# Patient Record
Sex: Female | Born: 1995 | Race: Black or African American | Hispanic: No | Marital: Single | Smoking: Current every day smoker
Health system: Southern US, Community
[De-identification: ages and names within clinical notes are randomized; demographics above are authoritative.]

## PROBLEM LIST (undated history)

## (undated) DIAGNOSIS — B009 Herpesviral infection, unspecified: Secondary | ICD-10-CM

## (undated) DIAGNOSIS — E669 Obesity, unspecified: Secondary | ICD-10-CM

## (undated) DIAGNOSIS — A749 Chlamydial infection, unspecified: Secondary | ICD-10-CM

## (undated) DIAGNOSIS — Z309 Encounter for contraceptive management, unspecified: Secondary | ICD-10-CM

## (undated) DIAGNOSIS — Z8619 Personal history of other infectious and parasitic diseases: Secondary | ICD-10-CM

## (undated) HISTORY — DX: Encounter for contraceptive management, unspecified: Z30.9

## (undated) HISTORY — DX: Herpesviral infection, unspecified: B00.9

## (undated) HISTORY — PX: EXTERNAL EAR SURGERY: SHX627

## (undated) HISTORY — PX: SALIVARY GLAND SURGERY: SHX768

## (undated) HISTORY — DX: Obesity, unspecified: E66.9

## (undated) HISTORY — DX: Personal history of other infectious and parasitic diseases: Z86.19

## (undated) HISTORY — DX: Chlamydial infection, unspecified: A74.9

---
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
## 2015-03-31 ENCOUNTER — Ambulatory Visit (INDEPENDENT_AMBULATORY_CARE_PROVIDER_SITE_OTHER): Payer: Medicaid Other | Admitting: Pediatrics

## 2015-03-31 VITALS — Wt 271.6 lb

## 2015-03-31 DIAGNOSIS — R894 Abnormal immunological findings in specimens from other organs, systems and tissues: Secondary | ICD-10-CM | POA: Insufficient documentation

## 2015-03-31 DIAGNOSIS — Z3041 Encounter for surveillance of contraceptive pills: Secondary | ICD-10-CM | POA: Diagnosis not present

## 2015-03-31 MED ORDER — LEVONORGEST-ETH ESTRAD 91-DAY 0.15-0.03 &0.01 MG PO TABS: 1.0000 | ORAL_TABLET | Freq: Every day | ORAL | Status: DC
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
## 2015-05-26 ENCOUNTER — Encounter: Payer: Medicaid Other | Admitting: Pediatrics

## 2015-11-06 ENCOUNTER — Encounter: Payer: Self-pay | Admitting: Adult Health

## 2015-11-06 ENCOUNTER — Ambulatory Visit (INDEPENDENT_AMBULATORY_CARE_PROVIDER_SITE_OTHER): Payer: Medicaid Other | Admitting: Adult Health

## 2015-11-06 VITALS — BP 100/68 | HR 70 | Ht 68.0 in | Wt 266.0 lb

## 2015-11-06 DIAGNOSIS — Z309 Encounter for contraceptive management, unspecified: Secondary | ICD-10-CM | POA: Diagnosis not present

## 2015-11-06 DIAGNOSIS — Z3202 Encounter for pregnancy test, result negative: Secondary | ICD-10-CM

## 2015-11-06 DIAGNOSIS — Z30011 Encounter for initial prescription of contraceptive pills: Secondary | ICD-10-CM

## 2015-11-06 DIAGNOSIS — Z01419 Encounter for gynecological examination (general) (routine) without abnormal findings: Secondary | ICD-10-CM

## 2015-11-06 DIAGNOSIS — Z113 Encounter for screening for infections with a predominantly sexual mode of transmission: Secondary | ICD-10-CM

## 2015-11-06 HISTORY — DX: Encounter for contraceptive management, unspecified: Z30.9

## 2015-11-06 LAB — POCT URINE PREGNANCY: Preg Test, Ur: NEGATIVE

## 2015-11-06 MED ORDER — NORETHIN-ETH ESTRAD-FE BIPHAS 1 MG-10 MCG / 10 MCG PO TABS: 1.0000 | ORAL_TABLET | Freq: Every day | ORAL | Status: DC

## 2015-11-06 NOTE — Progress Notes (Signed)
Patient ID: Alejandra Diaz N Melichar, female   DOB: 10/04/1996, 19 y.o.   MRN: [id number] History of Present Illness:  Alejandra Diaz is a 19 year old black female in for a well woman gyn exam and get on birth control,has family planning medicaid.She is using condoms.  Current Medications, Allergies, Past Medical History, Past Surgical History, Family History and Social History were reviewed in [facility] electronic medical record.     Review of Systems:  Patient denies any headaches, hearing loss, fatigue, blurred vision, shortness of breath, chest pain, abdominal pain, problems with bowel movements, urination, or intercourse. No joint pain or mood swings.   Physical Exam:BP 100/68 mmHg  Pulse 70  Ht 5\' 8"  (1.727 m)  Wt 266 lb (120.657 kg)  BMI 40.45 kg/m2  LMP 10/31/2015 UPT negative General:  Well developed, well nourished, no acute distress Skin:  Warm and dry Neck:  Midline trachea, normal thyroid, good ROM, no lymphadenopathy Lungs; Clear to auscultation bilaterally Breast:  No dominant palpable mass, retraction, or nipple discharge Cardiovascular: Regular rate and rhythm Abdomen:  Soft, non tender, no hepatosplenomegaly,obese Pelvic:  External genitalia is normal in appearance, no lesions.  The vagina is normal in appearance,pink period like blood. Urethra has no lesions or masses. The cervix is smooth.  Uterus is felt to be normal size, shape, and contour.  No adnexal masses or tenderness noted.Bladder is non tender, no masses felt. Extremities/musculoskeletal:  No swelling or varicosities noted, no clubbing or cyanosis Psych:  No mood changes, alert and cooperative,seems happy   Impression: Well woman gyn exam no pap Contraceptive management STD screening    Plan: Rx lo loestrin disp 1 pack take 1 daily with 11 refills,given 1 pack to start now, lot [id number] A exp 6/17  Check GC/CHL on urine HIV,RPR,HSV 2 Pap and physical in 1 year

## 2015-11-06 NOTE — Patient Instructions (Signed)
Start lo loestrin today Use condoms Pap and physical in  1 year

## 2015-11-07 LAB — RPR: RPR: NONREACTIVE

## 2015-11-07 LAB — HIV ANTIBODY (ROUTINE TESTING W REFLEX): HIV SCREEN 4TH GENERATION: NONREACTIVE

## 2015-11-07 LAB — HSV 2 ANTIBODY, IGG

## 2015-11-10 ENCOUNTER — Telehealth: Payer: Self-pay | Admitting: Adult Health

## 2015-11-10 LAB — GC/CHLAMYDIA PROBE AMP
Chlamydia trachomatis, NAA: POSITIVE — AB
Neisseria gonorrhoeae by PCR: NEGATIVE

## 2015-11-10 NOTE — Telephone Encounter (Signed)
Left message to call me in am 

## 2015-11-11 ENCOUNTER — Telehealth: Payer: Self-pay | Admitting: *Deleted

## 2015-11-11 MED ORDER — AZITHROMYCIN 500 MG PO TABS: ORAL_TABLET | ORAL | Status: DC

## 2015-11-11 NOTE — Addendum Note (Signed)
Addended by: Cyril MourningGRIFFIN, JENNIFER A on: 11/11/2015 09:15 AM   Modules accepted: Orders

## 2015-11-11 NOTE — Telephone Encounter (Signed)
Pt aware of labs and +CHL,rx azthromycin NCCDRDC sent,[place] [facility] [facility]

## 2015-12-04 ENCOUNTER — Ambulatory Visit (INDEPENDENT_AMBULATORY_CARE_PROVIDER_SITE_OTHER): Payer: Self-pay | Admitting: Adult Health

## 2015-12-04 ENCOUNTER — Encounter: Payer: Self-pay | Admitting: Adult Health

## 2015-12-04 VITALS — BP 90/62 | HR 78 | Ht 68.0 in | Wt 271.5 lb

## 2015-12-04 DIAGNOSIS — Z8619 Personal history of other infectious and parasitic diseases: Secondary | ICD-10-CM

## 2015-12-04 HISTORY — DX: Personal history of other infectious and parasitic diseases: Z86.19

## 2015-12-04 NOTE — Patient Instructions (Signed)
Use condoms and continue OCs Follow up prn

## 2015-12-04 NOTE — Progress Notes (Signed)
Subjective:     Patient ID: Alejandra Diaz N Silveri, female   DOB: 06/20/1996, 19 y.o.   MRN: [id number]  HPI [name] is a 19 year old black female in for proof of treatment for +chlamydia in November.She and partner took their meds and have not had sex.No complaints.  Review of Systems Patient denies any headaches, hearing loss, fatigue, blurred vision, shortness of breath, chest pain, abdominal pain, problems with bowel movements, urination, or intercourse. No joint pain or mood swings. Reviewed past medical,surgical, social and family history. Reviewed medications and allergies.     Objective:   Physical Exam BP 90/62 mmHg  Pulse 78  Ht 5\' 8"  (1.727 m)  Wt 271 lb 8 oz (123.152 kg)  BMI 41.29 kg/m2  LMP 10/31/2015 Skin warm and dry. Lungs: clear to ausculation bilaterally. Cardiovascular: regular rate and rhythm.Face time 10 minutes, counseling about using condoms for STD prevention and reviewed labs again with her.    Assessment:     History of chlamydia     Plan:     GC/CHL sent on urine Use condoms and continue OCs Follow up prn

## 2015-12-05 LAB — GC/CHLAMYDIA PROBE AMP
CHLAMYDIA, DNA PROBE: NEGATIVE
Neisseria gonorrhoeae by PCR: NEGATIVE

## 2016-01-16 ENCOUNTER — Emergency Department (HOSPITAL_COMMUNITY)
Admission: EM | Admit: 2016-01-16 | Discharge: 2016-01-16 | Disposition: A | Discharge status: Home / Self Care | Payer: No Typology Code available for payment source | Attending: Emergency Medicine | Admitting: Emergency Medicine

## 2016-01-16 ENCOUNTER — Encounter (HOSPITAL_COMMUNITY): Payer: Self-pay

## 2016-01-16 ENCOUNTER — Emergency Department (HOSPITAL_COMMUNITY): Payer: No Typology Code available for payment source

## 2016-01-16 DIAGNOSIS — Y92410 Unspecified street and highway as the place of occurrence of the external cause: Secondary | ICD-10-CM | POA: Insufficient documentation

## 2016-01-16 DIAGNOSIS — E669 Obesity, unspecified: Secondary | ICD-10-CM | POA: Diagnosis not present

## 2016-01-16 DIAGNOSIS — Y998 Other external cause status: Secondary | ICD-10-CM | POA: Diagnosis not present

## 2016-01-16 DIAGNOSIS — S63501A Unspecified sprain of right wrist, initial encounter: Secondary | ICD-10-CM | POA: Diagnosis not present

## 2016-01-16 DIAGNOSIS — Z3202 Encounter for pregnancy test, result negative: Secondary | ICD-10-CM | POA: Insufficient documentation

## 2016-01-16 DIAGNOSIS — S199XXA Unspecified injury of neck, initial encounter: Secondary | ICD-10-CM | POA: Diagnosis present

## 2016-01-16 DIAGNOSIS — Z793 Long term (current) use of hormonal contraceptives: Secondary | ICD-10-CM | POA: Insufficient documentation

## 2016-01-16 DIAGNOSIS — Y9389 Activity, other specified: Secondary | ICD-10-CM | POA: Insufficient documentation

## 2016-01-16 DIAGNOSIS — S161XXA Strain of muscle, fascia and tendon at neck level, initial encounter: Secondary | ICD-10-CM | POA: Insufficient documentation

## 2016-01-16 DIAGNOSIS — Z8619 Personal history of other infectious and parasitic diseases: Secondary | ICD-10-CM | POA: Diagnosis not present

## 2016-01-16 LAB — POC URINE PREG, ED
PREG TEST UR: NEGATIVE
Preg Test, Ur: NEGATIVE

## 2016-01-16 MED ORDER — IBUPROFEN 800 MG PO TABS
800.0000 mg | ORAL_TABLET | Freq: Once | ORAL | Status: AC
  Administered 2016-01-16: 800 mg via ORAL
  Filled 2016-01-16: qty 1

## 2016-01-16 MED ORDER — IBUPROFEN 800 MG PO TABS: 800.0000 mg | ORAL_TABLET | Freq: Three times a day (TID) | ORAL | Status: DC

## 2016-01-16 MED ORDER — CYCLOBENZAPRINE HCL 10 MG PO TABS: 10.0000 mg | ORAL_TABLET | Freq: Three times a day (TID) | ORAL | Status: DC | PRN
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
## 2016-01-16 NOTE — ED Notes (Signed)
Pt alert & oriented x4, stable gait. Patient given discharge instructions, paperwork & prescription(s). Patient  instructed to stop at the registration desk to finish any additional paperwork. Patient verbalized understanding. Pt left department w/ no further questions. 

## 2016-01-16 NOTE — ED Notes (Signed)
PA notified & OK w/ removing collar. C collar removed after negative c-spine resulted.

## 2016-01-16 NOTE — ED Notes (Signed)
Pt placed in c collar when moved to treatment room.
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
## 2016-10-13 ENCOUNTER — Ambulatory Visit: Payer: Medicaid Other | Admitting: Adult Health

## 2017-02-09 ENCOUNTER — Other Ambulatory Visit: Payer: Medicaid Other | Admitting: Adult Health

## 2017-02-24 ENCOUNTER — Other Ambulatory Visit: Payer: Medicaid Other | Admitting: Adult Health

## 2017-03-10 ENCOUNTER — Other Ambulatory Visit: Payer: Medicaid Other | Admitting: Adult Health

## 2017-10-29 ENCOUNTER — Emergency Department (HOSPITAL_COMMUNITY)
Admission: EM | Admit: 2017-10-29 | Discharge: 2017-10-29 | Disposition: A | Discharge status: Home / Self Care | Payer: Self-pay | Attending: Emergency Medicine | Admitting: Emergency Medicine

## 2017-10-29 ENCOUNTER — Encounter (HOSPITAL_COMMUNITY): Payer: Self-pay | Admitting: Emergency Medicine

## 2017-10-29 ENCOUNTER — Other Ambulatory Visit: Payer: Self-pay

## 2017-10-29 DIAGNOSIS — Z79899 Other long term (current) drug therapy: Secondary | ICD-10-CM | POA: Insufficient documentation

## 2017-10-29 DIAGNOSIS — Z202 Contact with and (suspected) exposure to infections with a predominantly sexual mode of transmission: Secondary | ICD-10-CM

## 2017-10-29 DIAGNOSIS — N898 Other specified noninflammatory disorders of vagina: Secondary | ICD-10-CM

## 2017-10-29 DIAGNOSIS — R300 Dysuria: Secondary | ICD-10-CM

## 2017-10-29 LAB — URINALYSIS, ROUTINE W REFLEX MICROSCOPIC
BILIRUBIN URINE: NEGATIVE
GLUCOSE, UA: NEGATIVE mg/dL
Ketones, ur: 5 mg/dL — AB
NITRITE: NEGATIVE
PH: 5 (ref 5.0–8.0)
Protein, ur: 30 mg/dL — AB
SPECIFIC GRAVITY, URINE: 1.028 (ref 1.005–1.030)

## 2017-10-29 LAB — WET PREP, GENITAL
Clue Cells Wet Prep HPF POC: NONE SEEN
SPERM: NONE SEEN
Trich, Wet Prep: NONE SEEN
YEAST WET PREP: NONE SEEN

## 2017-10-29 LAB — PREGNANCY, URINE: PREG TEST UR: NEGATIVE

## 2017-10-29 MED ORDER — AZITHROMYCIN 250 MG PO TABS
1000.0000 mg | ORAL_TABLET | Freq: Once | ORAL | Status: AC
  Administered 2017-10-29: 1000 mg via ORAL
  Filled 2017-10-29: qty 4

## 2017-10-29 MED ORDER — LIDOCAINE HCL (PF) 1 % IJ SOLN
INTRAMUSCULAR | Status: AC
  Administered 2017-10-29: 0.9 mL
  Filled 2017-10-29: qty 5

## 2017-10-29 MED ORDER — CEFTRIAXONE SODIUM 250 MG IJ SOLR
250.0000 mg | Freq: Once | INTRAMUSCULAR | Status: AC
  Administered 2017-10-29: 250 mg via INTRAMUSCULAR
  Filled 2017-10-29: qty 250

## 2017-10-29 NOTE — ED Triage Notes (Addendum)
Patient c/o dysuria x1 week. Denies any fevers, nausea, or vomiting. Denies any blood in urine. Patient does report some white discharge with itching. Denies any odor.

## 2017-10-29 NOTE — ED Provider Notes (Signed)
[facility]GENCY [facility]PARTMENT Provider Note   CSN: [id number] Arrival date & time: 10/29/17  1606     History   Chief Complaint Chief Complaint  Patient presents with  . Dysuria    HPI  Alejandra Diaz is a 21 y.o. Female who presents complaining of 1 week of burning with urination, and some increased vaginal discharge.  Patient reports she recently started dating someone new, and he was recently diagnosed with chlamydia so she wanted to come in and get checked as well.  Patient reports for the past week she has had some burning and discomfort with urination.  She reports discharge is like her typical discharge, slightly whitish, but she has had larger amounts of it over the past week.  Patient denies any fevers or chills, no abdominal pain, no nausea or vomiting.  She reports she uses condoms sometimes is on birth control.        Past Medical History:  Diagnosis Date  . Chlamydia   . Contraceptive management 11/06/2015  . Herpes simplex virus (HSV) infection   . History of chlamydia 12/04/2015  . Obesity     Patient Active Problem List   Diagnosis Date Noted  . History of chlamydia 12/04/2015  . Contraceptive management 11/06/2015  . Herpes simplex antibody positive 03/31/2015    Past Surgical History:  Procedure Laterality Date  . EXTERNAL EAR SURGERY     Keloids removed  . SALIVARY GLAND SURGERY     removed    OB History    Gravida Para Term Preterm AB Living   0 0 0 0 0 0   SAB TAB Ectopic Multiple Live Births   0 0 0 0         Home Medications    Prior to Admission medications   Medication Sig Start Date End Date Taking? Authorizing Provider  cyclobenzaprine (FLEXERIL) 10 MG tablet Take 1 tablet (10 mg total) by mouth 3 (three) times daily as needed. 01/16/16   Triplett, Tammy, PA-C  ibuprofen (ADVIL,MOTRIN) 800 MG tablet Take 1 tablet (800 mg total) by mouth 3 (three) times daily. 01/16/16   Triplett, Tammy, PA-C  Norethindrone-Ethinyl  Estradiol-Fe Biphas (LO LOESTRIN FE) 1 MG-10 MCG / 10 MCG tablet Take 1 tablet by mouth daily. Take 1 daily by mouth 11/06/15   Adline PotterGriffin, Jennifer A, NP    Family History Family History  Problem Relation Age of Onset  . Other Maternal Grandmother        on dialysis  . COPD Paternal Grandmother     Social History Social History   Tobacco Use  . Smoking status: Never Smoker  . Smokeless tobacco: Never Used  Substance Use Topics  . Alcohol use: No  . Drug use: Yes    Types: Marijuana    Comment: once a week     Allergies   Patient has no known allergies.   Review of Systems Review of Systems  Constitutional: Negative for chills and fever.  Gastrointestinal: Negative for abdominal pain, constipation, diarrhea, nausea and vomiting.  Genitourinary: Positive for dysuria, frequency and vaginal discharge. Negative for flank pain, hematuria and vaginal bleeding.  Musculoskeletal: Negative for back pain.  Skin: Negative for rash.     Physical Exam Updated Vital Signs BP 138/84 (BP Location: Right Arm)   Pulse (!) 53   Temp 98.1 F (36.7 C) (Oral)   Resp 18   Ht 5\' 10"  (1.778 m)   Wt 107 kg (236 lb)   LMP 10/18/2017  SpO2 100%   BMI 33.86 kg/m   Physical Exam  Constitutional: She appears well-developed and well-nourished. No distress.  HENT:  Head: Normocephalic and atraumatic.  Mouth/Throat: Oropharynx is clear and moist.  Eyes: Right eye exhibits no discharge. Left eye exhibits no discharge.  Pulmonary/Chest: Effort normal. No respiratory distress.  Abdominal: Soft. Bowel sounds are normal. She exhibits no distension and no mass. There is no tenderness. There is no guarding.  Genitourinary: There is no tenderness or lesion on the right labia. There is no tenderness or lesion on the left labia. Uterus is not tender. Cervix exhibits discharge (Yellow-white). Cervix exhibits no motion tenderness and no friability. Right adnexum displays no mass, no tenderness and no  fullness. Left adnexum displays no mass, no tenderness and no fullness. No erythema, tenderness or bleeding in the vagina. Vaginal discharge found.  Neurological: She is alert. Coordination normal.  Skin: Skin is warm and dry. Capillary refill takes less than 2 seconds. She is not diaphoretic.  Psychiatric: She has a normal mood and affect. Her behavior is normal.  Nursing note and vitals reviewed.    ED Treatments / Results  Labs (all labs ordered are listed, but only abnormal results are displayed) Labs Reviewed  WET PREP, GENITAL - Abnormal; Notable for the following components:      Result Value   WBC, Wet Prep HPF POC MANY (*)    All other components within normal limits  URINALYSIS, ROUTINE W REFLEX MICROSCOPIC - Abnormal; Notable for the following components:   APPearance CLOUDY (*)    Hgb urine dipstick SMALL (*)    Ketones, ur 5 (*)    Protein, ur 30 (*)    Leukocytes, UA LARGE (*)    Bacteria, UA FEW (*)    Squamous Epithelial / LPF TOO NUMEROUS TO COUNT (*)    All other components within normal limits  PREGNANCY, URINE  HIV ANTIBODY (ROUTINE TESTING)  RPR  GC/CHLAMYDIA PROBE AMP ([place]) NOT AT [facility]    EKG  EKG Interpretation None       Radiology No results found.  Procedures Procedures (including critical care time)  Medications Ordered in ED Medications  cefTRIAXone (ROCEPHIN) injection 250 mg (250 mg Intramuscular Given 10/29/17 1713)  azithromycin (ZITHROMAX) tablet 1,000 mg (1,000 mg Oral Given 10/29/17 1713)  lidocaine (PF) (XYLOCAINE) 1 % injection (0.9 mLs  Given 10/29/17 1714)     Initial Impression / Assessment and Plan / ED Course  I have reviewed the triage vital signs and the nursing notes.  Pertinent labs & imaging results that were available during my care of the patient were reviewed by me and considered in my medical decision making (see chart for details).  Pt presents with concerns for possible STD.  Pt understands that they  have GC/Chlamydia cultures pending, as well as HIV and RPR and that they will need to inform all sexual partners if results return positive. Pt has been treated prophylactically with azithromycin and Rocephin due to pts history, pelvic exam, and wet prep with increased WBCs, no clue cells. Pt not concerning for PID because hemodynamically stable and no cervical motion tenderness on pelvic exam.  Patient to be discharged with instructions to follow up with OBGYN/PCP. Discussed importance of using protection when sexually active.    Final Clinical Impressions(s) / ED Diagnoses   Final diagnoses:  Vaginal discharge  STD exposure  Dysuria    ED Discharge Orders    None       Ala Dach,  Arva ChafeKelsey N, PA-C 10/29/17 1744    Samuel JesterMcManus, Kathleen, DO 11/01/17 1007

## 2017-10-29 NOTE — Discharge Instructions (Addendum)
You were treated for gonorrhea and chlamydia today.  You will be contacted in 2-3 days if any of your test results come back positive.  Please use condoms for protection and inform any other sexual partners.  If you develop fevers, chills, nausea or vomiting, or lower abdominal pain please return to the emergency department for reevaluation.  Otherwise you may follow-up with health department for similar complaints in the future.  You may also use the phone number provided to establish care with a primary doctor.

## 2017-10-29 NOTE — ED Notes (Signed)
Call to lab- need labs in rm 20

## 2017-10-30 LAB — HIV ANTIBODY (ROUTINE TESTING W REFLEX): HIV SCREEN 4TH GENERATION: NONREACTIVE

## 2017-10-30 LAB — RPR: RPR Ser Ql: NONREACTIVE

## 2017-10-31 LAB — GC/CHLAMYDIA PROBE AMP (~~LOC~~) NOT AT ARMC
Chlamydia: POSITIVE — AB
Neisseria Gonorrhea: NEGATIVE

## 2017-11-16 ENCOUNTER — Other Ambulatory Visit: Payer: Self-pay

## 2017-11-16 ENCOUNTER — Encounter (HOSPITAL_COMMUNITY): Payer: Self-pay | Admitting: *Deleted

## 2017-11-16 ENCOUNTER — Emergency Department (HOSPITAL_COMMUNITY)
Admission: EM | Admit: 2017-11-16 | Discharge: 2017-11-16 | Disposition: A | Discharge status: Home / Self Care | Payer: Self-pay | Attending: Emergency Medicine | Admitting: Emergency Medicine

## 2017-11-16 DIAGNOSIS — Z202 Contact with and (suspected) exposure to infections with a predominantly sexual mode of transmission: Secondary | ICD-10-CM | POA: Insufficient documentation

## 2017-11-16 LAB — URINALYSIS, ROUTINE W REFLEX MICROSCOPIC
Bilirubin Urine: NEGATIVE
GLUCOSE, UA: NEGATIVE mg/dL
HGB URINE DIPSTICK: NEGATIVE
Ketones, ur: 5 mg/dL — AB
Leukocytes, UA: NEGATIVE
Nitrite: NEGATIVE
PH: 5 (ref 5.0–8.0)
PROTEIN: NEGATIVE mg/dL
Specific Gravity, Urine: 1.025 (ref 1.005–1.030)

## 2017-11-16 LAB — POC URINE PREG, ED: Preg Test, Ur: NEGATIVE

## 2017-11-16 LAB — WET PREP, GENITAL
SPERM: NONE SEEN
Trich, Wet Prep: NONE SEEN
WBC, Wet Prep HPF POC: NONE SEEN
Yeast Wet Prep HPF POC: NONE SEEN

## 2017-11-16 NOTE — ED Notes (Addendum)
Pt walked past nurses station and stated she did not "want to be here all night." Pt did not sign topaz signature pad however pt did receive instructions from PA EdenMurray about her discharge.

## 2017-11-16 NOTE — ED Provider Notes (Signed)
[facility] Provider Note   CSN: [id number] Arrival date & time: 11/16/17  1720     History   Chief Complaint Chief Complaint  Patient presents with  . SEXUALLY TRANSMITTED DISEASE    HPI Alejandra Diaz is a 21 y.o. female.  HPI   Patient is a 21 year old female with no significant past medical history presenting for concern for STI exposure.  Patient presented on 10-30-19 for vaginal discharge and was diagnosed with chlamydia.  No other co-infections at that time.  Patient's partner was also treated, however he continues to have symptoms of dysuria, discharge, lower abdominal pain.  Patient is concerned about reinfection.  Patient is not currently having a change in the quantity, color, or odor of her vaginal discharge.  No lower abdominal pain, nausea, vomiting.  No dyspareunia.  No dysuria.  Last menstrual period was October 29.   Past Medical History:  Diagnosis Date  . Chlamydia   . Contraceptive management 11/06/2015  . Herpes simplex virus (HSV) infection   . History of chlamydia 12/04/2015  . Obesity     Patient Active Problem List   Diagnosis Date Noted  . History of chlamydia 12/04/2015  . Contraceptive management 11/06/2015  . Herpes simplex antibody positive 03/31/2015    Past Surgical History:  Procedure Laterality Date  . EXTERNAL EAR SURGERY     Keloids removed  . SALIVARY GLAND SURGERY     removed    OB History    Gravida Para Term Preterm AB Living   0 0 0 0 0 0   SAB TAB Ectopic Multiple Live Births   0 0 0 0         Home Medications    Prior to Admission medications   Medication Sig Start Date End Date Taking? Authorizing Provider  cyclobenzaprine (FLEXERIL) 10 MG tablet Take 1 tablet (10 mg total) by mouth 3 (three) times daily as needed. 01/16/16   Triplett, Tammy, PA-C  ibuprofen (ADVIL,MOTRIN) 800 MG tablet Take 1 tablet (800 mg total) by mouth 3 (three) times daily. 01/16/16   Triplett, Tammy, PA-C    Norethindrone-Ethinyl Estradiol-Fe Biphas (LO LOESTRIN FE) 1 MG-10 MCG / 10 MCG tablet Take 1 tablet by mouth daily. Take 1 daily by mouth 11/06/15   Adline PotterGriffin, Jennifer A, NP    Family History Family History  Problem Relation Age of Onset  . Other Maternal Grandmother        on dialysis  . COPD Paternal Grandmother     Social History Social History   Tobacco Use  . Smoking status: Never Smoker  . Smokeless tobacco: Never Used  Substance Use Topics  . Alcohol use: No  . Drug use: Yes    Types: Marijuana    Comment: once a week     Allergies   Patient has no known allergies.   Review of Systems Review of Systems  Constitutional: Negative for chills and fever.  HENT: Negative for sore throat.   Gastrointestinal: Negative for abdominal pain.  Genitourinary: Negative for dyspareunia, dysuria, genital sores, pelvic pain, vaginal bleeding, vaginal discharge and vaginal pain.     Physical Exam Updated Vital Signs BP 136/70   Pulse 69   Temp 99.6 F (37.6 C) (Oral)   Resp 16   Ht 5\' 10"  (1.778 m)   Wt 108 kg (238 lb)   LMP 10/17/2017   SpO2 99%   BMI 34.15 kg/m   Physical Exam  Constitutional: She appears well-developed and well-nourished. No  distress.  Sitting comfortably in bed.  HENT:  Head: Normocephalic and atraumatic.  Eyes: Conjunctivae are normal. Right eye exhibits no discharge. Left eye exhibits no discharge.  EOMs normal to gross examination.  Neck: Normal range of motion.  Pulmonary/Chest:  Normal respiratory effort. Patient converses comfortably. No audible wheeze or stridor.  Abdominal: She exhibits no distension. There is no tenderness. There is no guarding.  Musculoskeletal: Normal range of motion.  Neurological: She is alert.  Cranial nerves intact to gross observation. Patient moves extremities without difficulty.  Skin: Skin is warm and dry. She is not diaphoretic.  Psychiatric: She has a normal mood and affect. Her behavior is normal.  Judgment and thought content normal.  Nursing note and vitals reviewed.    ED Treatments / Results  Labs (all labs ordered are listed, but only abnormal results are displayed) Labs Reviewed  WET PREP, GENITAL - Abnormal; Notable for the following components:      Result Value   Clue Cells Wet Prep HPF POC PRESENT (*)    All other components within normal limits  URINALYSIS, ROUTINE W REFLEX MICROSCOPIC - Abnormal; Notable for the following components:   APPearance HAZY (*)    Ketones, ur 5 (*)    All other components within normal limits  POC URINE PREG, ED  GC/CHLAMYDIA PROBE AMP ([place]) NOT AT [facility]    EKG  EKG Interpretation None       Radiology No results found.  Procedures Procedures (including critical care time)  Medications Ordered in ED Medications - No data to display   Initial Impression / Assessment and Plan / ED Course  I have reviewed the triage vital signs and the nursing notes.  Pertinent labs & imaging results that were available during my care of the patient were reviewed by me and considered in my medical decision making (see chart for details).     Final Clinical Impressions(s) / ED Diagnoses   Final diagnoses:  Possible exposure to STD   Patient is well-appearing, asymptomatic of vaginal discharge, lower abdominal pain, or dysuria at this time.  Patient is concerned about re-exposure to STI.  Patient's wet prep is not concerning for reinfection of gonorrhea or chlamydia, as no WBCs were seen.  Patient does have clue cells, however this is not clinically correlate as patient has had no vaginal discharge.  Engaged in shared decision making with patient, and suggested that we wait until the cultures for GC/CT come back.  Patient eloped off the floor before receiving paperwork, however verbal return precautions given for any fevers, chills, lower abdominal pain, dysuria, or increasing vaginal discharge.  Patient is in understanding and agrees  with the plan of care.  ED Discharge Orders    None       Delia ChimesMurray, Alyssa B, PA-C 11/17/17 1714   Medical screening examination/treatment/procedure(s) were performed by non-physician practitioner and as supervising physician I was immediately available for consultation/collaboration.   EKG Interpretation None         Vanetta MuldersZackowski, Latrell Reitan, MD 11/18/17 1805

## 2017-11-16 NOTE — ED Triage Notes (Signed)
Pt c/o Alejandra Diaz colored vaginal discharge x 2 weeks. Pt was treated for chlamydia 2 weeks ago and feels she still has it because the vaginal discharge has continued. Denies dysuria.

## 2017-11-18 LAB — GC/CHLAMYDIA PROBE AMP (~~LOC~~) NOT AT ARMC
CHLAMYDIA, DNA PROBE: NEGATIVE
Neisseria Gonorrhea: NEGATIVE

## 2018-04-25 ENCOUNTER — Emergency Department (HOSPITAL_COMMUNITY)
Admission: EM | Admit: 2018-04-25 | Discharge: 2018-04-25 | Disposition: A | Discharge status: Home / Self Care | Payer: Self-pay

## 2018-05-03 ENCOUNTER — Ambulatory Visit: Payer: Self-pay | Admitting: Adult Health

## 2018-05-03 ENCOUNTER — Encounter: Payer: Self-pay | Admitting: *Deleted

## 2018-11-25 ENCOUNTER — Emergency Department (HOSPITAL_COMMUNITY)
Admission: EM | Admit: 2018-11-25 | Discharge: 2018-11-25 | Disposition: A | Discharge status: Home / Self Care | Payer: Self-pay | Attending: Emergency Medicine | Admitting: Emergency Medicine

## 2018-11-25 ENCOUNTER — Encounter (HOSPITAL_COMMUNITY): Payer: Self-pay | Admitting: *Deleted

## 2018-11-25 ENCOUNTER — Other Ambulatory Visit: Payer: Self-pay

## 2018-11-25 DIAGNOSIS — Z202 Contact with and (suspected) exposure to infections with a predominantly sexual mode of transmission: Secondary | ICD-10-CM | POA: Insufficient documentation

## 2018-11-25 DIAGNOSIS — F17200 Nicotine dependence, unspecified, uncomplicated: Secondary | ICD-10-CM | POA: Insufficient documentation

## 2018-11-25 MED ORDER — STERILE WATER FOR INJECTION IJ SOLN
INTRAMUSCULAR | Status: AC
  Administered 2018-11-25: 2 mL
  Filled 2018-11-25: qty 10

## 2018-11-25 MED ORDER — AZITHROMYCIN 250 MG PO TABS
1000.0000 mg | ORAL_TABLET | Freq: Once | ORAL | Status: AC
  Administered 2018-11-25: 1000 mg via ORAL
  Filled 2018-11-25: qty 4

## 2018-11-25 MED ORDER — CEFTRIAXONE SODIUM 250 MG IJ SOLR
250.0000 mg | INTRAMUSCULAR | Status: DC
  Administered 2018-11-25: 250 mg via INTRAMUSCULAR
  Filled 2018-11-25: qty 250

## 2018-11-25 NOTE — ED Notes (Signed)
Patient left without discharge papers.

## 2018-11-25 NOTE — ED Triage Notes (Signed)
States she was advised by her sexual partner she has been exposed to clamydiia

## 2018-11-25 NOTE — ED Notes (Signed)
Patient does not want to undress for pelvic exam

## 2018-11-25 NOTE — Discharge Instructions (Addendum)
You have been treated for exposure to chlamydia.  Follow-up with your primary care doctor or health department.

## 2018-11-25 NOTE — ED Provider Notes (Signed)
[facility] EMERGENCY DEPARTMENT Provider Note   CSN: [id number] Arrival date & time: 11/25/18  1107     History   Chief Complaint Chief Complaint  Patient presents with  . Exposure to STD    HPI Alejandra Diaz is a 22 y.o. female.  Patient presents with a concern about exposure to chlamydia.  She states her boyfriend gave her this information.  She does report a malodorous vaginal discharge.  One previous history of chlamydia.  No fever, chills, dysuria, abdominal pain, flank pain. Severity if sxs is mild.     Past Medical History:  Diagnosis Date  . Chlamydia   . Contraceptive management 11/06/2015  . Herpes simplex virus (HSV) infection   . History of chlamydia 12/04/2015  . Obesity     Patient Active Problem List   Diagnosis Date Noted  . History of chlamydia 12/04/2015  . Contraceptive management 11/06/2015  . Herpes simplex antibody positive 03/31/2015    Past Surgical History:  Procedure Laterality Date  . EXTERNAL EAR SURGERY     Keloids removed  . SALIVARY GLAND SURGERY     removed     OB History    Gravida  0   Para  0   Term  0   Preterm  0   AB  0   Living  0     SAB  0   TAB  0   Ectopic  0   Multiple  0   Live Births               Home Medications    Prior to Admission medications   Medication Sig Start Date End Date Taking? Authorizing Provider  cyclobenzaprine (FLEXERIL) 10 MG tablet Take 1 tablet (10 mg total) by mouth 3 (three) times daily as needed. 01/16/16   Triplett, Tammy, PA-C  ibuprofen (ADVIL,MOTRIN) 800 MG tablet Take 1 tablet (800 mg total) by mouth 3 (three) times daily. 01/16/16   Triplett, Tammy, PA-C  Norethindrone-Ethinyl Estradiol-Fe Biphas (LO LOESTRIN FE) 1 MG-10 MCG / 10 MCG tablet Take 1 tablet by mouth daily. Take 1 daily by mouth 11/06/15   Adline Potter, NP    Family History Family History  Problem Relation Age of Onset  . Other Maternal Grandmother        on dialysis  . COPD  Paternal Grandmother     Social History Social History   Tobacco Use  . Smoking status: Current Every Day Smoker    Types: Cigars  . Smokeless tobacco: Never Used  Substance Use Topics  . Alcohol use: No  . Drug use: Yes    Types: Marijuana    Comment: once a week     Allergies   Patient has no known allergies.   Review of Systems Review of Systems  All other systems reviewed and are negative.    Physical Exam Updated Vital Signs BP 119/76   Pulse 83   Temp 98.2 F (36.8 C) (Oral)   Resp 18   Ht 5\' 8"  (1.727 m)   Wt 112 kg   LMP 11/22/2018 (Exact Date)   SpO2 94%   BMI 37.56 kg/m   Physical Exam  Constitutional: She is oriented to person, place, and time. She appears well-developed and well-nourished.  HENT:  Head: Normocephalic and atraumatic.  Eyes: Conjunctivae are normal.  Neck: Neck supple.  Pulmonary/Chest: Effort normal.  Abdominal: Soft. Bowel sounds are normal.  Nontender abdomen  Genitourinary:  Genitourinary Comments: Patient deferred exam  Musculoskeletal: Normal range of motion.  Neurological: She is alert and oriented to person, place, and time.  Skin: Skin is warm and dry.  Psychiatric: She has a normal mood and affect. Her behavior is normal.  Nursing note and vitals reviewed.    ED Treatments / Results  Labs (all labs ordered are listed, but only abnormal results are displayed) Labs Reviewed - No data to display  EKG None  Radiology No results found.  Procedures Procedures (including critical care time)  Medications Ordered in ED Medications  cefTRIAXone (ROCEPHIN) injection 250 mg (250 mg Intramuscular Given 11/25/18 1548)  azithromycin (ZITHROMAX) tablet 1,000 mg (1,000 mg Oral Given 11/25/18 1547)  sterile water (preservative free) injection (2 mLs  Given 11/25/18 1548)     Initial Impression / Assessment and Plan / ED Course  I have reviewed the triage vital signs and the nursing notes.  Pertinent labs & imaging  results that were available during my care of the patient were reviewed by me and considered in my medical decision making (see chart for details).     Patient presents with chlamydia exposure.  She did not want to have a pelvic exam performed secondary to currently being on her menstrual period.  Will treat with Rocephin 250 mg IM and Zithromax 1 g oral.  Discussed with the patient.  Final Clinical Impressions(s) / ED Diagnoses   Final diagnoses:  STD exposure    ED Discharge Orders    None       Donnetta Hutchingook, Osamu Olguin, MD 11/25/18 [phone]

## 2022-03-30 ENCOUNTER — Emergency Department (HOSPITAL_COMMUNITY): Payer: Self-pay

## 2022-03-30 ENCOUNTER — Emergency Department (HOSPITAL_COMMUNITY)
Admission: EM | Admit: 2022-03-30 | Discharge: 2022-03-30 | Disposition: A | Discharge status: Home / Self Care | Payer: Self-pay | Attending: Emergency Medicine | Admitting: Emergency Medicine

## 2022-03-30 ENCOUNTER — Encounter (HOSPITAL_COMMUNITY): Payer: Self-pay | Admitting: *Deleted

## 2022-03-30 DIAGNOSIS — J209 Acute bronchitis, unspecified: Secondary | ICD-10-CM | POA: Insufficient documentation

## 2022-03-30 DIAGNOSIS — Z20822 Contact with and (suspected) exposure to covid-19: Secondary | ICD-10-CM | POA: Insufficient documentation

## 2022-03-30 LAB — RESP PANEL BY RT-PCR (FLU A&B, COVID) ARPGX2
Influenza A by PCR: NEGATIVE
Influenza B by PCR: NEGATIVE
SARS Coronavirus 2 by RT PCR: NEGATIVE

## 2022-03-30 MED ORDER — AZITHROMYCIN 250 MG PO TABS: 250.0000 mg | ORAL_TABLET | Freq: Every day | ORAL | 0 refills | Status: DC

## 2022-03-30 MED ORDER — PREDNISONE 50 MG PO TABS
60.0000 mg | ORAL_TABLET | Freq: Once | ORAL | Status: AC
  Administered 2022-03-30: 60 mg via ORAL
  Filled 2022-03-30: qty 1

## 2022-03-30 MED ORDER — ALBUTEROL SULFATE (2.5 MG/3ML) 0.083% IN NEBU
5.0000 mg | INHALATION_SOLUTION | Freq: Once | RESPIRATORY_TRACT | Status: AC
  Administered 2022-03-30: 5 mg via RESPIRATORY_TRACT
  Filled 2022-03-30: qty 6

## 2022-03-30 MED ORDER — PREDNISONE 10 MG PO TABS: 30.0000 mg | ORAL_TABLET | Freq: Every day | ORAL | 0 refills | Status: AC

## 2022-03-30 MED ORDER — ALBUTEROL SULFATE HFA 108 (90 BASE) MCG/ACT IN AERS
1.0000 | INHALATION_SPRAY | Freq: Once | RESPIRATORY_TRACT | Status: AC
  Administered 2022-03-30: 2 via RESPIRATORY_TRACT
  Filled 2022-03-30: qty 6.7

## 2022-03-30 MED ORDER — AZITHROMYCIN 250 MG PO TABS
500.0000 mg | ORAL_TABLET | Freq: Once | ORAL | Status: AC
Start: 2022-03-30 — End: 2022-03-30
  Administered 2022-03-30: 500 mg via ORAL
  Filled 2022-03-30: qty 2

## 2022-03-30 MED ORDER — AEROCHAMBER Z-STAT PLUS/MEDIUM MISC
1.0000 | Freq: Once | Status: AC
  Administered 2022-03-30: 1

## 2022-03-30 NOTE — Progress Notes (Signed)
Instructed patient on MDI with spacer use for home. Patient displayed understanding of how and when to use inhaler. ?

## 2022-03-30 NOTE — ED Notes (Signed)
Respiratory called for neb tx 

## 2022-03-30 NOTE — ED Triage Notes (Signed)
Multiple complaints, states she is short of breath for 4 days ?

## 2022-03-30 NOTE — ED Provider Notes (Signed)
?[facility] ?Provider Note ? ? ?CSN: [id number] ?Arrival date & time: 03/30/22  1702 ? ?  ? ?History ? ?Chief Complaint  ?Patient presents with  ? Shortness of Breath  ? ? ?Alejandra Diaz is a 26 y.o. female with no significant past medical history presenting with a 4-day history of cough, shortness of breath and wheezing.  She also reports having some mild nasal congestion which has been clear.  She endorses significant shortness of breath.  She does not have a history of asthma.  She states she went out in [place] over the weekend to celebrate her birthday and is afraid she picked up some sort of respiratory virus.  She has been coughing up sputum, usually clear but did see some pink tinge sputum once or twice, however states the Robitussin she is taking is purple color and may be the source of the pink-tinged sputum.  She has had no fevers or chills.,  Denies sore throat, ear pain, headache, nausea or vomiting.  She has taken TheraFlu and used Robitussin with honey without significant improvement in her symptoms. ? ? ? ? ?The history is provided by the patient.  ? ?  ? ?Home Medications ?Prior to Admission medications   ?Medication Sig Start Date End Date Taking? Authorizing Provider  ?azithromycin (ZITHROMAX) 250 MG tablet Take 1 tablet (250 mg total) by mouth daily. Take 1 tablet daily for 4 days 03/30/22  Yes Atreus Hasz, Raynelle Fanning, PA-C  ?predniSONE (DELTASONE) 10 MG tablet Take 3 tablets (30 mg total) by mouth daily for 4 days. 03/30/22 04/03/22 Yes Yajaira Doffing, Raynelle Fanning, PA-C  ?cyclobenzaprine (FLEXERIL) 10 MG tablet Take 1 tablet (10 mg total) by mouth 3 (three) times daily as needed. 01/16/16   Triplett, Tammy, PA-C  ?ibuprofen (ADVIL,MOTRIN) 800 MG tablet Take 1 tablet (800 mg total) by mouth 3 (three) times daily. 01/16/16   Pauline Aus, PA-C  ?Norethindrone-Ethinyl Estradiol-Fe Biphas (LO LOESTRIN FE) 1 MG-10 MCG / 10 MCG tablet Take 1 tablet by mouth daily. Take 1 daily by mouth 11/06/15   Adline Potter, NP  ?   ? ?Allergies    ?Patient has no known allergies.   ? ?Review of Systems   ?Review of Systems  ?Constitutional:  Negative for chills and fever.  ?HENT:  Positive for congestion and rhinorrhea. Negative for ear pain, sinus pressure, sore throat, trouble swallowing and voice change.   ?Eyes:  Negative for discharge.  ?Respiratory:  Positive for cough, chest tightness and wheezing. Negative for shortness of breath and stridor.   ?Cardiovascular:  Negative for chest pain.  ?Gastrointestinal:  Negative for abdominal pain.  ?Genitourinary: Negative.   ?All other systems reviewed and are negative. ? ?Physical Exam ?Updated Vital Signs ?BP 104/70 (BP Location: Left Arm)   Pulse 87   Temp 98.3 ?F (36.8 ?C) (Oral)   Resp 18   Ht 5\' 8"  (1.727 m)   Wt 113.4 kg   LMP 03/27/2022   SpO2 97%   BMI 38.01 kg/m?  ?Physical Exam ?Constitutional:   ?   Appearance: She is well-developed.  ?HENT:  ?   Head: Normocephalic and atraumatic.  ?   Right Ear: Tympanic membrane and ear canal normal.  ?   Left Ear: Tympanic membrane and ear canal normal.  ?   Nose: Mucosal edema and rhinorrhea present.  ?   Mouth/Throat:  ?   Pharynx: Uvula midline. No oropharyngeal exudate or posterior oropharyngeal erythema.  ?   Tonsils: No tonsillar abscesses.  ?  Eyes:  ?   Conjunctiva/sclera: Conjunctivae normal.  ?Cardiovascular:  ?   Rate and Rhythm: Normal rate.  ?   Heart sounds: Normal heart sounds.  ?Pulmonary:  ?   Effort: Pulmonary effort is normal. No respiratory distress.  ?   Breath sounds: Wheezing present. No rales.  ?   Comments: Expiratory wheeze throughout all lung fields with prolonged expirations. ?Abdominal:  ?   Palpations: Abdomen is soft.  ?   Tenderness: There is no abdominal tenderness.  ?Musculoskeletal:     ?   General: Normal range of motion.  ?   Cervical back: Normal range of motion.  ?Skin: ?   General: Skin is warm and dry.  ?   Findings: No rash.  ?Neurological:  ?   Mental Status: She is alert and  oriented to person, place, and time.  ? ? ?ED Results / Procedures / Treatments   ?Labs ?(all labs ordered are listed, but only abnormal results are displayed) ?Labs Reviewed  ?RESP PANEL BY RT-PCR (FLU A&B, COVID) [id number]  ? ? ?EKG ?EKG Interpretation ? ?Date/Time:  Tuesday March 30 2022 17:24:48 EDT ?Ventricular Rate:  89 ?PR Interval:  136 ?QRS Duration: 84 ?QT Interval:  270 ?QTC Calculation: 328 ?R Axis:   -35 ?Text Interpretation: Normal sinus rhythm Left axis deviation Abnormal ECG No previous ECGs available Confirmed by Linwood DibblesKnapp, Jon [id number]) on 03/30/2022 5:33:39 PM ? ?Radiology ?DG Chest 2 View ? ?Result Date: 03/30/2022 ?CLINICAL DATA:  Cough, shortness of breath and wheezing. EXAM: CHEST - 2 VIEW COMPARISON:  None. FINDINGS: The cardiomediastinal contours are normal. Mild bronchial thickening. Subsegmental bibasilar atelectasis. Pulmonary vasculature is normal. No confluent consolidation, pleural effusion, or pneumothorax. No acute osseous abnormalities are seen. IMPRESSION: Mild bronchial thickening suggesting asthma or bronchitis. Subsegmental bibasilar atelectasis. Electronically Signed   By: Narda RutherfordMelanie  Sanford M.D.   On: 03/30/2022 18:27   ? ?Procedures ?Procedures  ? ? ?Medications Ordered in ED ?Medications  ?albuterol (PROVENTIL) (2.5 MG/3ML) 0.083% nebulizer solution 5 mg (5 mg Nebulization Given 03/30/22 1817)  ?albuterol (PROVENTIL) (2.5 MG/3ML) 0.083% nebulizer solution 5 mg (5 mg Nebulization Given 03/30/22 2024)  ?predniSONE (DELTASONE) tablet 60 mg (60 mg Oral Given 03/30/22 2006)  ?azithromycin (ZITHROMAX) tablet 500 mg (500 mg Oral Given 03/30/22 2006)  ?aerochamber Z-Stat Plus/medium 1 each (1 each Other Given 03/30/22 2029)  ?albuterol (VENTOLIN HFA) 108 (90 Base) MCG/ACT inhaler 1-2 puff (2 puffs Inhalation Provided for home use 03/30/22 2026)  ? ? ?ED Course/ Medical Decision Making/ A&P ?  ?                        ?Medical Decision Making ?Patient with a 4-day history of cough, shortness of breath  and wheezing, nasal congestion, suggestive of a viral respiratory process.  Otherwise healthy 26 year old female.  She did have significant wheezing on exam, patient was given albuterol neb treatment x1 after which upon reevaluation she continued to have wheezing although it was improved and she was aerating better.  A repeat albuterol neb was given along with 60 mg of prednisone p.o.  After the second treatment her breathing was significantly improved, minimal wheezing noted.  She was able to ambulate in the department without desaturation.  Her chest x-ray suggests a bronchitis, no pneumonia.  She was given an albuterol MDI with a spacer and instructed in its use.  She also be prescribed prednisone and Zithromax to cover for bacterial source of bronchitis.  She was  stable at time of discharge. ? ?Amount and/or Complexity of Data Reviewed ?Labs: ordered. ?   Details: Respiratory panel is negative for COVID and influenza. ?Radiology: ordered and independent interpretation performed. ?   Details: No pneumonia, bronchitic changes. ?ECG/medicine tests: ordered. ?   Details: Normal sinus rhythm ? ?Risk ?OTC drugs. ?Prescription drug management. ?Decision regarding hospitalization. ?Risk Details: No indication for hospitalization at this time. ? ? ? ? ? ? ? ? ? ? ?Final Clinical Impression(s) / ED Diagnoses ?Final diagnoses:  ?Acute bronchitis, unspecified organism  ? ? ?Rx / DC Orders ?ED Discharge Orders   ? ?      Ordered  ?  azithromycin (ZITHROMAX) 250 MG tablet  Daily       ? 03/30/22 2255  ?  predniSONE (DELTASONE) 10 MG tablet  Daily       ? 03/30/22 2255  ? ?  ?  ? ?  ? ? ?  ?Burgess Amor, PA-C ?03/31/22 2353 ? ?  ?Linwood Dibbles, MD ?04/01/22 [phone] ? ?

## 2022-03-30 NOTE — ED Notes (Signed)
Pt little short of breath with walking about 100 feet.  RA sats 91-93%, by the  end pt's RA sats at 90%.  Pt with cough as well.  ?

## 2022-03-30 NOTE — ED Notes (Signed)
Spoke with pt concerning her medication prescription at [facility] and when to take the meds next.  ?

## 2022-03-30 NOTE — ED Notes (Signed)
Pt left the room and dept.  ?

## 2022-07-21 ENCOUNTER — Other Ambulatory Visit: Payer: Self-pay | Admitting: Obstetrics & Gynecology

## 2022-07-21 DIAGNOSIS — O3680X0 Pregnancy with inconclusive fetal viability, not applicable or unspecified: Secondary | ICD-10-CM

## 2022-07-22 ENCOUNTER — Ambulatory Visit (INDEPENDENT_AMBULATORY_CARE_PROVIDER_SITE_OTHER): Payer: Medicaid Other

## 2022-07-22 DIAGNOSIS — O3680X0 Pregnancy with inconclusive fetal viability, not applicable or unspecified: Secondary | ICD-10-CM | POA: Diagnosis not present

## 2022-07-22 DIAGNOSIS — Z3A01 Less than 8 weeks gestation of pregnancy: Secondary | ICD-10-CM

## 2022-07-22 NOTE — Progress Notes (Signed)
[place] 7+6 wks,single IUP with yolk sac,normal ovaries,FHR 164 bpm,CRL 18.41 mm

## 2022-08-16 ENCOUNTER — Other Ambulatory Visit (HOSPITAL_COMMUNITY): Payer: Self-pay | Admitting: Obstetrics and Gynecology

## 2022-08-16 DIAGNOSIS — N644 Mastodynia: Secondary | ICD-10-CM

## 2022-08-18 ENCOUNTER — Other Ambulatory Visit: Payer: Self-pay | Admitting: Obstetrics & Gynecology

## 2022-08-18 DIAGNOSIS — Z3682 Encounter for antenatal screening for nuchal translucency: Secondary | ICD-10-CM

## 2022-08-19 DIAGNOSIS — Z3400 Encounter for supervision of normal first pregnancy, unspecified trimester: Secondary | ICD-10-CM | POA: Insufficient documentation

## 2022-08-20 ENCOUNTER — Other Ambulatory Visit: Payer: Self-pay

## 2022-08-20 ENCOUNTER — Other Ambulatory Visit (HOSPITAL_COMMUNITY)
Admission: RE | Admit: 2022-08-20 | Discharge: 2022-08-20 | Disposition: A | Discharge status: Home / Self Care | Payer: Medicaid Other | Source: Ambulatory Visit | Attending: Advanced Practice Midwife | Admitting: Advanced Practice Midwife

## 2022-08-20 ENCOUNTER — Ambulatory Visit: Payer: Self-pay | Admitting: *Deleted

## 2022-08-20 ENCOUNTER — Encounter: Payer: Self-pay | Admitting: Advanced Practice Midwife

## 2022-08-20 ENCOUNTER — Ambulatory Visit (INDEPENDENT_AMBULATORY_CARE_PROVIDER_SITE_OTHER): Payer: Self-pay | Admitting: Advanced Practice Midwife

## 2022-08-20 VITALS — BP 125/56 | HR 65 | Wt 256.0 lb

## 2022-08-20 DIAGNOSIS — Z124 Encounter for screening for malignant neoplasm of cervix: Secondary | ICD-10-CM | POA: Insufficient documentation

## 2022-08-20 DIAGNOSIS — Z113 Encounter for screening for infections with a predominantly sexual mode of transmission: Secondary | ICD-10-CM

## 2022-08-20 DIAGNOSIS — Z363 Encounter for antenatal screening for malformations: Secondary | ICD-10-CM

## 2022-08-20 DIAGNOSIS — Z3A12 12 weeks gestation of pregnancy: Secondary | ICD-10-CM | POA: Insufficient documentation

## 2022-08-20 DIAGNOSIS — Z6837 Body mass index (BMI) 37.0-37.9, adult: Secondary | ICD-10-CM

## 2022-08-20 DIAGNOSIS — Z3401 Encounter for supervision of normal first pregnancy, first trimester: Secondary | ICD-10-CM | POA: Diagnosis not present

## 2022-08-20 LAB — POCT URINALYSIS DIPSTICK OB
Blood, UA: NEGATIVE
Glucose, UA: NEGATIVE
Ketones, UA: NEGATIVE
Leukocytes, UA: NEGATIVE
Nitrite, UA: NEGATIVE
POC,PROTEIN,UA: NEGATIVE

## 2022-08-20 MED ORDER — ASPIRIN 81 MG PO CHEW: 162.0000 mg | CHEWABLE_TABLET | Freq: Every day | ORAL | 7 refills | Status: DC

## 2022-08-20 NOTE — Patient Instructions (Signed)
Alejandra Diaz, thank you for choosing our office today! We appreciate the opportunity to meet your healthcare needs. You may receive a short survey by mail, e-mail, or through [employer]. If you are happy with your care we would appreciate if you could take just a few minutes to complete the survey questions. We read all of your comments and take your feedback very seriously. Thank you again for choosing our office.  Center for [employer] Healthcare Team at [facility]  [facility] at [facility] ([address] [place], [place] [address]) Entrance C, located off of [address] Free 24/7 valet parking   Nausea & Vomiting Have saltine crackers or pretzels by your bed and eat a few bites before you raise your head out of bed in the morning Eat small frequent meals throughout the day instead of large meals Drink plenty of fluids throughout the day to stay hydrated, just don't drink a lot of fluids with your meals.  This can make your stomach fill up faster making you feel sick Do not brush your teeth right after you eat Products with real ginger are good for nausea, like ginger ale and ginger hard candy Make sure it says made with real ginger! Sucking on sour candy like lemon heads is also good for nausea If your prenatal vitamins make you nauseated, take them at night so you will sleep through the nausea Sea Bands If you feel like you need medicine for the nausea & vomiting please let us know If you are unable to keep any fluids or food down please let us know   Constipation Drink plenty of fluid, preferably water, throughout the day Eat foods high in fiber such as fruits, vegetables, and grains Exercise, such as walking, is a good way to keep your bowels regular Drink warm fluids, especially warm prune juice, or decaf coffee Eat a 1/2 cup of real oatmeal (not instant), 1/2 cup applesauce, and 1/2-1 cup warm prune juice every day If needed, you may take Colace (docusate sodium) stool softener  once or twice a day to help keep the stool soft.  If you still are having problems with constipation, you may take Miralax once daily as needed to help keep your bowels regular.   Home Blood Pressure Monitoring for Patients   Your provider has recommended that you check your blood pressure (BP) at least once a week at home. If you do not have a blood pressure cuff at home, one will be provided for you. Contact your provider if you have not received your monitor within 1 week.   Helpful Tips for Accurate Home Blood Pressure Checks  Don't smoke, exercise, or drink caffeine 30 minutes before checking your BP Use the restroom before checking your BP (a full bladder can raise your pressure) Relax in a comfortable upright chair Feet on the ground Left arm resting comfortably on a flat surface at the level of your heart Legs uncrossed Back supported Sit quietly and don't talk Place the cuff on your bare arm Adjust snuggly, so that only two fingertips can fit between your skin and the top of the cuff Check 2 readings separated by at least one minute Keep a log of your BP readings For a visual, please reference this diagram: http://ccnc.care/bpdiagram  Provider Name: [facility]     Phone: [phone]  Zone 1: ALL CLEAR  Continue to monitor your symptoms:  BP reading is less than 140 (top number) or less than 90 (bottom  number)  No right upper stomach pain No headaches or seeing spots No feeling nauseated or throwing up No swelling in face and hands  Zone 2: CAUTION Call your doctor's office for any of the following:  BP reading is greater than 140 (top number) or greater than 90 (bottom number)  Stomach pain under your ribs in the middle or right side Headaches or seeing spots Feeling nauseated or throwing up Swelling in face and hands  Zone 3: EMERGENCY  Seek immediate medical care if you have any of the following:  BP reading is greater than160 (top number) or greater than  110 (bottom number) Severe headaches not improving with Tylenol Serious difficulty catching your breath Any worsening symptoms from Zone 2    First Trimester of Pregnancy The first trimester of pregnancy is from week 1 until the end of week 12 (months 1 through 3). A week after a sperm fertilizes an egg, the egg will implant on the wall of the uterus. This embryo will begin to develop into a baby. Genes from you and your partner are forming the baby. The female genes determine whether the baby is a boy or a girl. At 6-8 weeks, the eyes and face are formed, and the heartbeat can be seen on ultrasound. At the end of 12 weeks, all the baby's organs are formed.  Now that you are pregnant, you will want to do everything you can to have a healthy baby. Two of the most important things are to get good prenatal care and to follow your health care provider's instructions. Prenatal care is all the medical care you receive before the baby's birth. This care will help prevent, find, and treat any problems during the pregnancy and childbirth. BODY CHANGES Your body goes through many changes during pregnancy. The changes vary from woman to woman.  You may gain or lose a couple of pounds at first. You may feel sick to your stomach (nauseous) and throw up (vomit). If the vomiting is uncontrollable, call your health care provider. You may tire easily. You may develop headaches that can be relieved by medicines approved by your health care provider. You may urinate more often. Painful urination may mean you have a bladder infection. You may develop heartburn as a result of your pregnancy. You may develop constipation because certain hormones are causing the muscles that push waste through your intestines to slow down. You may develop hemorrhoids or swollen, bulging veins (varicose veins). Your breasts may begin to grow larger and become tender. Your nipples may stick out more, and the tissue that surrounds them  (areola) may become darker. Your gums may bleed and may be sensitive to brushing and flossing. Dark spots or blotches (chloasma, mask of pregnancy) may develop on your face. This will likely fade after the baby is born. Your menstrual periods will stop. You may have a loss of appetite. You may develop cravings for certain kinds of food. You may have changes in your emotions from day to day, such as being excited to be pregnant or being concerned that something may go wrong with the pregnancy and baby. You may have more vivid and strange dreams. You may have changes in your hair. These can include thickening of your hair, rapid growth, and changes in texture. Some women also have hair loss during or after pregnancy, or hair that feels dry or thin. Your hair will most likely return to normal after your baby is born. WHAT TO EXPECT AT YOUR PRENATAL  VISITS During a routine prenatal visit: You will be weighed to make sure you and the baby are growing normally. Your blood pressure will be taken. Your abdomen will be measured to track your baby's growth. The fetal heartbeat will be listened to starting around week 10 or 12 of your pregnancy. Test results from any previous visits will be discussed. Your health care provider may ask you: How you are feeling. If you are feeling the baby move. If you have had any abnormal symptoms, such as leaking fluid, bleeding, severe headaches, or abdominal cramping. If you have any questions. Other tests that may be performed during your first trimester include: Blood tests to find your blood type and to check for the presence of any previous infections. They will also be used to check for low iron levels (anemia) and Rh antibodies. Later in the pregnancy, blood tests for diabetes will be done along with other tests if problems develop. Urine tests to check for infections, diabetes, or protein in the urine. An ultrasound to confirm the proper growth and development  of the baby. An amniocentesis to check for possible genetic problems. Fetal screens for spina bifida and Down syndrome. You may need other tests to make sure you and the baby are doing well. HOME CARE INSTRUCTIONS  Medicines Follow your health care provider's instructions regarding medicine use. Specific medicines may be either safe or unsafe to take during pregnancy. Take your prenatal vitamins as directed. If you develop constipation, try taking a stool softener if your health care provider approves. Diet Eat regular, well-balanced meals. Choose a variety of foods, such as meat or vegetable-based protein, fish, milk and low-fat dairy products, vegetables, fruits, and whole grain breads and cereals. Your health care provider will help you determine the amount of weight gain that is right for you. Avoid raw meat and uncooked cheese. These carry germs that can cause birth defects in the baby. Eating four or five small meals rather than three large meals a day may help relieve nausea and vomiting. If you start to feel nauseous, eating a few soda crackers can be helpful. Drinking liquids between meals instead of during meals also seems to help nausea and vomiting. If you develop constipation, eat more high-fiber foods, such as fresh vegetables or fruit and whole grains. Drink enough fluids to keep your urine clear or pale yellow. Activity and Exercise Exercise only as directed by your health care provider. Exercising will help you: Control your weight. Stay in shape. Be prepared for labor and delivery. Experiencing pain or cramping in the lower abdomen or low back is a good sign that you should stop exercising. Check with your health care provider before continuing normal exercises. Try to avoid standing for long periods of time. Move your legs often if you must stand in one place for a long time. Avoid heavy lifting. Wear low-heeled shoes, and practice good posture. You may continue to have sex  unless your health care provider directs you otherwise. Relief of Pain or Discomfort Wear a good support bra for breast tenderness.   Take warm sitz baths to soothe any pain or discomfort caused by hemorrhoids. Use hemorrhoid cream if your health care provider approves.   Rest with your legs elevated if you have leg cramps or low back pain. If you develop varicose veins in your legs, wear support hose. Elevate your feet for 15 minutes, 3-4 times a day. Limit salt in your diet. Prenatal Care Schedule your prenatal visits by the  twelfth week of pregnancy. They are usually scheduled monthly at first, then more often in the last 2 months before delivery. Write down your questions. Take them to your prenatal visits. Keep all your prenatal visits as directed by your health care provider. Safety Wear your seat belt at all times when driving. Make a list of emergency phone numbers, including numbers for family, friends, the hospital, and police and fire departments. General Tips Ask your health care provider for a referral to a local prenatal education class. Begin classes no later than at the beginning of month 6 of your pregnancy. Ask for help if you have counseling or nutritional needs during pregnancy. Your health care provider can offer advice or refer you to specialists for help with various needs. Do not use hot tubs, steam rooms, or saunas. Do not douche or use tampons or scented sanitary pads. Do not cross your legs for long periods of time. Avoid cat litter boxes and soil used by cats. These carry germs that can cause birth defects in the baby and possibly loss of the fetus by miscarriage or stillbirth. Avoid all smoking, herbs, alcohol, and medicines not prescribed by your health care provider. Chemicals in these affect the formation and growth of the baby. Schedule a dentist appointment. At home, brush your teeth with a soft toothbrush and be gentle when you floss. SEEK MEDICAL CARE IF:   You have dizziness. You have mild pelvic cramps, pelvic pressure, or nagging pain in the abdominal area. You have persistent nausea, vomiting, or diarrhea. You have a bad smelling vaginal discharge. You have pain with urination. You notice increased swelling in your face, hands, legs, or ankles. SEEK IMMEDIATE MEDICAL CARE IF:  You have a fever. You are leaking fluid from your vagina. You have spotting or bleeding from your vagina. You have severe abdominal cramping or pain. You have rapid weight gain or loss. You vomit blood or material that looks like coffee grounds. You are exposed to [place] measles and have never had them. You are exposed to fifth disease or chickenpox. You develop a severe headache. You have shortness of breath. You have any kind of trauma, such as from a fall or a car accident. Document Released: 11/30/2001 Document Revised: 04/22/2014 Document Reviewed: 10/16/2013 [facility] Patient Information 2015 [place], [place]. This information is not intended to replace advice given to you by your health care provider. Make sure you discuss any questions you have with your health care provider.

## 2022-08-20 NOTE — Progress Notes (Signed)
INITIAL OBSTETRICAL VISIT Patient name: Alejandra Diaz MRN [id number]  Date of birth: 1996/01/01 Chief Complaint:   Routine Prenatal Visit  History of Present Illness:   Alejandra Diaz is a 26 y.o. G107P0000 African-American female at [redacted]w[redacted]d by LMP c/w u/s at 8.2 weeks with an Estimated Date of Delivery: 03/04/23 being seen today for her initial obstetrical visit.   Patient's last menstrual period was 05/28/2022. Her obstetrical history is significant for primigravida.   Today she reports no complaints.  Last pap >22yrs ago. Results were:  neg     08/20/2022   10:24 AM  Depression screen PHQ 2/9  Decreased Interest 2  Down, Depressed, Hopeless 1  PHQ - 2 Score 3  Altered sleeping 0  Tired, decreased energy 3  Change in appetite 1  Feeling bad or failure about yourself  0  Trouble concentrating 0  Moving slowly or fidgety/restless 0  Suicidal thoughts 0  PHQ-9 Score 7        08/20/2022   10:25 AM  GAD 7 : Generalized Anxiety Score  Nervous, Anxious, on Edge 1  Control/stop worrying 0  Worry too much - different things 0  Trouble relaxing 0  Restless 0  Easily annoyed or irritable 3  Afraid - awful might happen 1  Total GAD 7 Score 5     Review of Systems:   Pertinent items are noted in HPI Denies cramping/contractions, leakage of fluid, vaginal bleeding, abnormal vaginal discharge w/ itching/odor/irritation, headaches, visual changes, shortness of breath, chest pain, abdominal pain, severe nausea/vomiting, or problems with urination or bowel movements unless otherwise stated above.  Pertinent History Reviewed:  Reviewed past medical,surgical, social, obstetrical and family history.  Reviewed problem list, medications and allergies. OB History  Gravida Para Term Preterm AB Living  1 0 0 0 0 0  SAB IAB Ectopic Multiple Live Births  0 0 0 0      # Outcome Date GA Lbr Len/2nd Weight Sex Delivery Anes PTL Lv  1 Current            Physical Assessment:   Vitals:    08/20/22 1008  BP: (!) 125/56  Pulse: 65  Weight: 256 lb (116.1 kg)  Body mass index is 38.92 kg/m.       Physical Examination:  General appearance - well appearing, and in no distress  Mental status - alert, oriented to person, place, and time  Psych:  She has a normal mood and affect  Skin - warm and dry, normal color, no suspicious lesions noted  Chest - effort normal, all lung fields clear to auscultation bilaterally  Heart - normal rate and regular rhythm  Abdomen - soft, nontender; FHTs 155bpm  Extremities:  No swelling or varicosities noted  Pelvic - VULVA: normal appearing vulva with no masses, tenderness or lesions  VAGINA: normal appearing vagina with normal color and discharge, no lesions  CERVIX: normal appearing cervix without discharge or lesions, no CMT  Thin prep pap is done with HR HPV cotesting  Chaperone: Jobe Marker    TODAY'S NT (doing on 08/24/22)  No results found for this or any previous visit (from the past 24 hour(s)).  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    Dispense:  60 tablet    Refill:  7    Order Specific Question:   Supervising Provider    Answer:   Myna Hidalgo [[id number]]    Initial labs obtained Continue prenatal vitamins Reviewed n/v relief measures and warning s/s to report Reviewed recommended weight gain based on pre-gravid BMI Encouraged well-balanced diet Genetic & carrier screening discussed: requests Panorama and NT/IT, declines Horizon  Ultrasound discussed; fetal survey: requested CCNC completed> form faxed if has or is planning to apply for medicaid The nature of [facility]  [employer] with multiple MDs and other Advanced Practice Providers was explained to patient; also emphasized that fellows, residents, and students are part of our team. Does have home bp cuff. Office bp cuff given: no. Rx sent: no. Check bp weekly, let us know if consistently >140/90.   Indications for ASA therapy (per uptodate) OR Two or more of the following: Nulliparity Yes Obesity (BMI>30 kg/m2) Yes  Indications for early A1C (per uptodate) BMI >=25 (>=23 in Asian women) AND one of the following High-risk race/ethnicity (eg, African American, Latino, Native American, [place] American, [place] Islander) Yes  Follow-up: Return in about 4 weeks (around 09/17/2022) for LROB, 2nd IT; then 8 weeks anatomy scan with LROB.   Orders Placed This Encounter  Procedures   Urine Culture   US OB Comp + 14 Wk   CBC/D/Plt+RPR+Rh+ABO+RubIgG...   Hgb Fractionation Cascade   Hemoglobin A1c   POC Urinalysis Dipstick OB    [name] [facility] 08/20/2022 11:22 AM

## 2022-08-24 ENCOUNTER — Ambulatory Visit (INDEPENDENT_AMBULATORY_CARE_PROVIDER_SITE_OTHER): Payer: Medicaid Other

## 2022-08-24 DIAGNOSIS — Z3682 Encounter for antenatal screening for nuchal translucency: Secondary | ICD-10-CM

## 2022-08-24 DIAGNOSIS — Z3A12 12 weeks gestation of pregnancy: Secondary | ICD-10-CM | POA: Diagnosis not present

## 2022-08-24 DIAGNOSIS — Z3401 Encounter for supervision of normal first pregnancy, first trimester: Secondary | ICD-10-CM | POA: Diagnosis not present

## 2022-08-24 DIAGNOSIS — Z3402 Encounter for supervision of normal first pregnancy, second trimester: Secondary | ICD-10-CM

## 2022-08-24 LAB — URINE CULTURE

## 2022-08-24 NOTE — Progress Notes (Signed)
Korea 12+4 wks,measurements c/w dates,CRL 67.70 mm,FHR 136 bpm,NB present,unable to obtain NT because of fetal position

## 2022-08-25 LAB — HEMOGLOBIN A1C
Est. average glucose Bld gHb Est-mCnc: 114 mg/dL
Hgb A1c MFr Bld: 5.6 % (ref 4.8–5.6)

## 2022-08-25 NOTE — Addendum Note (Signed)
Addended by: Moss Mc on: 08/25/2022 11:40 AM   Modules accepted: Orders

## 2022-08-26 ENCOUNTER — Encounter: Payer: Self-pay | Admitting: Advanced Practice Midwife

## 2022-08-26 DIAGNOSIS — A510 Primary genital syphilis: Secondary | ICD-10-CM | POA: Insufficient documentation

## 2022-08-26 DIAGNOSIS — Z8619 Personal history of other infectious and parasitic diseases: Secondary | ICD-10-CM | POA: Insufficient documentation

## 2022-08-26 LAB — CBC/D/PLT+RPR+RH+ABO+RUBIGG...
Antibody Screen: NEGATIVE
Basophils Absolute: 0 10*3/uL (ref 0.0–0.2)
Basos: 0 %
EOS (ABSOLUTE): 0.1 10*3/uL (ref 0.0–0.4)
Eos: 2 %
HCV Ab: NONREACTIVE
HIV Screen 4th Generation wRfx: NONREACTIVE
Hematocrit: 39.6 % (ref 34.0–46.6)
Hemoglobin: 12.8 g/dL (ref 11.1–15.9)
Hepatitis B Surface Ag: NEGATIVE
Immature Grans (Abs): 0 10*3/uL (ref 0.0–0.1)
Immature Granulocytes: 0 %
Lymphocytes Absolute: 2.9 10*3/uL (ref 0.7–3.1)
Lymphs: 50 %
MCH: 25.4 pg — ABNORMAL LOW (ref 26.6–33.0)
MCHC: 32.3 g/dL (ref 31.5–35.7)
MCV: 79 fL (ref 79–97)
Monocytes Absolute: 0.4 10*3/uL (ref 0.1–0.9)
Monocytes: 7 %
Neutrophils Absolute: 2.5 10*3/uL (ref 1.4–7.0)
Neutrophils: 41 %
Platelets: 259 10*3/uL (ref 150–450)
RBC: 5.04 x10E6/uL (ref 3.77–5.28)
RDW: 14.1 % (ref 11.7–15.4)
RPR Ser Ql: REACTIVE — AB
Rh Factor: POSITIVE
Rubella Antibodies, IGG: 1.83 index (ref >0.99)
WBC: 6 10*3/uL (ref 3.4–10.8)

## 2022-08-26 LAB — HGB FRACTIONATION CASCADE
Hgb A2: 2.7 % (ref 1.8–3.2)
Hgb A: 97.3 % (ref 96.4–98.8)
Hgb F: 0 % (ref 0.0–2.0)
Hgb S: 0 %

## 2022-08-26 LAB — RPR, QUANT+TP ABS (REFLEX)
Rapid Plasma Reagin, Quant: 1:4 {titer} — ABNORMAL HIGH
T Pallidum Abs: REACTIVE — AB

## 2022-08-26 LAB — HCV INTERPRETATION

## 2022-08-27 ENCOUNTER — Encounter: Payer: Self-pay | Admitting: *Deleted

## 2022-08-27 LAB — CYTOLOGY - PAP
Chlamydia: NEGATIVE
Comment: NEGATIVE
Comment: NEGATIVE
Comment: NORMAL
Diagnosis: UNDETERMINED — AB
High risk HPV: NEGATIVE
Neisseria Gonorrhea: NEGATIVE

## 2022-08-30 LAB — PANORAMA PRENATAL TEST FULL PANEL:PANORAMA TEST PLUS 5 ADDITIONAL MICRODELETIONS: FETAL FRACTION: 5.9

## 2022-09-02 ENCOUNTER — Ambulatory Visit (INDEPENDENT_AMBULATORY_CARE_PROVIDER_SITE_OTHER): Payer: Medicaid Other

## 2022-09-02 ENCOUNTER — Encounter: Payer: Self-pay | Admitting: Advanced Practice Midwife

## 2022-09-02 ENCOUNTER — Other Ambulatory Visit: Payer: Self-pay

## 2022-09-02 VITALS — BP 125/79 | HR 87 | Wt 255.2 lb

## 2022-09-02 DIAGNOSIS — O98119 Syphilis complicating pregnancy, unspecified trimester: Secondary | ICD-10-CM

## 2022-09-02 MED ORDER — PENICILLIN G BENZATHINE 1200000 UNIT/2ML IM SUSY
2.4000 10*6.[IU] | PREFILLED_SYRINGE | Freq: Once | INTRAMUSCULAR | Status: AC
  Administered 2022-09-02: 2.4 10*6.[IU] via INTRAMUSCULAR

## 2022-09-02 NOTE — Progress Notes (Signed)
Alejandra Diaz here for Bicillin  Injection. Per chart review patient believes she has had past infection of Syphilis, but remains unsure. Patient will not need treatment today if she has had a past infection. Reviewed with Alvester Morin MD who recommends I contact [facility] to determine if patient has had prior infection. Called Elfredia Nevins with  Public Health who states there is no record of this patient having prior Syphilis infection. Pt will need Bicillin 2.4 million units x 3 weekly injections. Injection administered without complication. Patient will return in 1 week for next injection.  Marjo Bicker, RN 09/02/2022  3:13 PM

## 2022-09-09 ENCOUNTER — Ambulatory Visit (INDEPENDENT_AMBULATORY_CARE_PROVIDER_SITE_OTHER): Payer: Medicaid Other | Admitting: *Deleted

## 2022-09-09 ENCOUNTER — Other Ambulatory Visit: Payer: Self-pay

## 2022-09-09 VITALS — BP 110/73 | HR 72 | Ht 68.0 in | Wt 253.5 lb

## 2022-09-09 DIAGNOSIS — O98119 Syphilis complicating pregnancy, unspecified trimester: Secondary | ICD-10-CM | POA: Diagnosis not present

## 2022-09-09 MED ORDER — PENICILLIN G BENZATHINE 1200000 UNIT/2ML IM SUSY
2.4000 10*6.[IU] | PREFILLED_SYRINGE | Freq: Once | INTRAMUSCULAR | Status: AC
  Administered 2022-09-09: 2.4 10*6.[IU] via INTRAMUSCULAR

## 2022-09-09 NOTE — Progress Notes (Signed)
Here for 2nd injections  of 3  for treatment of Syphilis. FHR 153. Injections given one in each hip  without complaint. Sent to desk to schedule 3rd injection.  Staci Acosta

## 2022-09-15 ENCOUNTER — Ambulatory Visit (INDEPENDENT_AMBULATORY_CARE_PROVIDER_SITE_OTHER): Payer: Medicaid Other | Admitting: Advanced Practice Midwife

## 2022-09-15 ENCOUNTER — Encounter: Payer: Self-pay | Admitting: Advanced Practice Midwife

## 2022-09-15 VITALS — BP 114/81 | HR 74 | Wt 260.0 lb

## 2022-09-15 DIAGNOSIS — Z3A15 15 weeks gestation of pregnancy: Secondary | ICD-10-CM

## 2022-09-15 DIAGNOSIS — Z3402 Encounter for supervision of normal first pregnancy, second trimester: Secondary | ICD-10-CM

## 2022-09-15 NOTE — Patient Instructions (Signed)
Alejandra Diaz, thank you for choosing our office today! We appreciate the opportunity to meet your healthcare needs. You may receive a short survey by mail, e-mail, or through MyChart. If you are happy with your care we would appreciate if you could take just a few minutes to complete the survey questions. We read all of your comments and take your feedback very seriously. Thank you again for choosing our office.  [facility] Team at [facility] ([address] [place], [place] [address]) Entrance C, located off of [address] Free 24/7 valet parking  Go to [email or web address] to register for FREE online childbirth classes  Call the office ([phone]) or go to [facility] if: You begin to severe cramping Your water breaks.  Sometimes it is a big gush of fluid, sometimes it is just a trickle that keeps getting your panties wet or running down your legs You have vaginal bleeding.  It is normal to have a small amount of spotting if your cervix was checked.   [place] Pediatricians/Family Doctors [facility] ([facility]): [address], [phone]           [facility]: [address], [phone]                [facility] ([facility]): [address], [phone] (call to ask if accepting patients) [facility]: [address], [place], [phone]    [place] Pediatricians/Family Doctors [facility] ([facility]): [address], [phone] [facility]: [address], [phone] [facility]: [address], [phone]  [place] [facility]  [facility] ([facility]): [phone] [facility]: [address], [phone]   [place] Family Doctors [facility]: [address], [phone]  [place] [facility]  [facility]: [address], [phone]  Home Blood Pressure Monitoring for Patients   Your provider has recommended that you check your blood pressure (BP) at least once a week at home. If you do not have a blood pressure cuff at home, one will be provided for you. Contact your provider if you have not received your monitor within 1 week.   Helpful Tips for Accurate Home Blood Pressure Checks  Don't smoke, exercise, or drink caffeine 30 minutes before checking your BP Use the restroom before checking your BP (a full bladder can raise your pressure) Relax in a comfortable upright chair Feet on the ground Left arm resting comfortably on a flat surface at the level of your heart Legs uncrossed Back supported Sit quietly and don't talk Place the cuff on your bare arm Adjust snuggly, so that only two fingertips can fit between your skin and the top of the cuff Check 2 readings separated by at least one minute Keep a log of your BP readings For a visual, please reference this diagram: http://ccnc.care/bpdiagram  Provider Name: [facility]     Phone: [phone]  Zone 1: ALL CLEAR  Continue to monitor your symptoms:  BP reading is less than 140 (top number) or less than 90 (bottom number)  No right upper stomach pain No headaches or seeing spots No feeling nauseated or throwing up No swelling in face and hands  Zone 2: CAUTION Call your doctor's office for any of the following:  BP reading is greater than 140 (top number) or greater than   90 (bottom number)  Stomach pain under your ribs in the middle or right side Headaches or seeing spots Feeling nauseated or throwing up Swelling in face and hands  Zone 3: EMERGENCY  Seek immediate medical care if you have any of the following:  BP reading is greater than160 (top number) or greater than 110 (bottom number) Severe headaches not improving with Tylenol Serious difficulty catching your breath Any worsening symptoms from  Zone 2     Second Trimester of Pregnancy The second trimester is from week 14 through week 27 (months 4 through 6). The second trimester is often a time when you feel your best. Your body has adjusted to being pregnant, and you begin to feel better physically. Usually, morning sickness has lessened or quit completely, you may have more energy, and you may have an increase in appetite. The second trimester is also a time when the fetus is growing rapidly. At the end of the sixth month, the fetus is about 9 inches long and weighs about 1 pounds. You will likely begin to feel the baby move (quickening) between 16 and 20 weeks of pregnancy. Body changes during your second trimester Your body continues to go through many changes during your second trimester. The changes vary from woman to woman. Your weight will continue to increase. You will notice your lower abdomen bulging out. You may begin to get stretch marks on your hips, abdomen, and breasts. You may develop headaches that can be relieved by medicines. The medicines should be approved by your health care provider. You may urinate more often because the fetus is pressing on your bladder. You may develop or continue to have heartburn as a result of your pregnancy. You may develop constipation because certain hormones are causing the muscles that push waste through your intestines to slow down. You may develop hemorrhoids or swollen, bulging veins (varicose veins). You may have back pain. This is caused by: Weight gain. Pregnancy hormones that are relaxing the joints in your pelvis. A shift in weight and the muscles that support your balance. Your breasts will continue to grow and they will continue to become tender. Your gums may bleed and may be sensitive to brushing and flossing. Dark spots or blotches (chloasma, mask of pregnancy) may develop on your face. This will likely fade after the baby is born. A dark line from your belly button to  the pubic area (linea nigra) may appear. This will likely fade after the baby is born. You may have changes in your hair. These can include thickening of your hair, rapid growth, and changes in texture. Some women also have hair loss during or after pregnancy, or hair that feels dry or thin. Your hair will most likely return to normal after your baby is born.  What to expect at prenatal visits During a routine prenatal visit: You will be weighed to make sure you and the fetus are growing normally. Your blood pressure will be taken. Your abdomen will be measured to track your baby's growth. The fetal heartbeat will be listened to. Any test results from the previous visit will be discussed.  Your health care provider may ask you: How you are feeling. If you are feeling the baby move. If you have had any abnormal symptoms, such as leaking fluid, bleeding, severe headaches, or abdominal cramping. If you are using any tobacco products, including cigarettes, chewing tobacco, and electronic cigarettes. If you have any questions.  Other tests that may be performed during   your second trimester include: Blood tests that check for: Low iron levels (anemia). High blood sugar that affects pregnant women (gestational diabetes) between 24 and 28 weeks. Rh antibodies. This is to check for a protein on red blood cells (Rh factor). Urine tests to check for infections, diabetes, or protein in the urine. An ultrasound to confirm the proper growth and development of the baby. An amniocentesis to check for possible genetic problems. Fetal screens for spina bifida and Down syndrome. HIV (human immunodeficiency virus) testing. Routine prenatal testing includes screening for HIV, unless you choose not to have this test.  Follow these instructions at home: Medicines Follow your health care provider's instructions regarding medicine use. Specific medicines may be either safe or unsafe to take during  pregnancy. Take a prenatal vitamin that contains at least 600 micrograms (mcg) of folic acid. If you develop constipation, try taking a stool softener if your health care provider approves. Eating and drinking Eat a balanced diet that includes fresh fruits and vegetables, whole grains, good sources of protein such as meat, eggs, or tofu, and low-fat dairy. Your health care provider will help you determine the amount of weight gain that is right for you. Avoid raw meat and uncooked cheese. These carry germs that can cause birth defects in the baby. If you have low calcium intake from food, talk to your health care provider about whether you should take a daily calcium supplement. Limit foods that are high in fat and processed sugars, such as fried and sweet foods. To prevent constipation: Drink enough fluid to keep your urine clear or pale yellow. Eat foods that are high in fiber, such as fresh fruits and vegetables, whole grains, and beans. Activity Exercise only as directed by your health care provider. Most women can continue their usual exercise routine during pregnancy. Try to exercise for 30 minutes at least 5 days a week. Stop exercising if you experience uterine contractions. Avoid heavy lifting, wear low heel shoes, and practice good posture. A sexual relationship may be continued unless your health care provider directs you otherwise. Relieving pain and discomfort Wear a good support bra to prevent discomfort from breast tenderness. Take warm sitz baths to soothe any pain or discomfort caused by hemorrhoids. Use hemorrhoid cream if your health care provider approves. Rest with your legs elevated if you have leg cramps or low back pain. If you develop varicose veins, wear support hose. Elevate your feet for 15 minutes, 3-4 times a day. Limit salt in your diet. Prenatal Care Write down your questions. Take them to your prenatal visits. Keep all your prenatal visits as told by your health  care provider. This is important. Safety Wear your seat belt at all times when driving. Make a list of emergency phone numbers, including numbers for family, friends, the hospital, and police and fire departments. General instructions Ask your health care provider for a referral to a local prenatal education class. Begin classes no later than the beginning of month 6 of your pregnancy. Ask for help if you have counseling or nutritional needs during pregnancy. Your health care provider can offer advice or refer you to specialists for help with various needs. Do not use hot tubs, steam rooms, or saunas. Do not douche or use tampons or scented sanitary pads. Do not cross your legs for long periods of time. Avoid cat litter boxes and soil used by cats. These carry germs that can cause birth defects in the baby and possibly loss of the   fetus by miscarriage or stillbirth. Avoid all smoking, herbs, alcohol, and unprescribed drugs. Chemicals in these products can affect the formation and growth of the baby. Do not use any products that contain nicotine or tobacco, such as cigarettes and e-cigarettes. If you need help quitting, ask your health care provider. Visit your dentist if you have not gone yet during your pregnancy. Use a soft toothbrush to brush your teeth and be gentle when you floss. Contact a health care provider if: You have dizziness. You have mild pelvic cramps, pelvic pressure, or nagging pain in the abdominal area. You have persistent nausea, vomiting, or diarrhea. You have a bad smelling vaginal discharge. You have pain when you urinate. Get help right away if: You have a fever. You are leaking fluid from your vagina. You have spotting or bleeding from your vagina. You have severe abdominal cramping or pain. You have rapid weight gain or weight loss. You have shortness of breath with chest pain. You notice sudden or extreme swelling of your face, hands, ankles, feet, or legs. You  have not felt your baby move in over an hour. You have severe headaches that do not go away when you take medicine. You have vision changes. Summary The second trimester is from week 14 through week 27 (months 4 through 6). It is also a time when the fetus is growing rapidly. Your body goes through many changes during pregnancy. The changes vary from woman to woman. Avoid all smoking, herbs, alcohol, and unprescribed drugs. These chemicals affect the formation and growth your baby. Do not use any tobacco products, such as cigarettes, chewing tobacco, and e-cigarettes. If you need help quitting, ask your health care provider. Contact your health care provider if you have any questions. Keep all prenatal visits as told by your health care provider. This is important. This information is not intended to replace advice given to you by your health care provider. Make sure you discuss any questions you have with your health care provider. Document Released: 11/30/2001 Document Revised: 05/13/2016 Document Reviewed: 02/06/2013 Elsevier Interactive Patient Education  2017 Elsevier Inc.  

## 2022-09-15 NOTE — Progress Notes (Signed)
   LOW-RISK PREGNANCY VISIT Patient name: Alejandra Diaz MRN [id number]  Date of birth: 06-26-1996 Chief Complaint:   Routine Prenatal Visit  History of Present Illness:   Alejandra Diaz is a 26 y.o. G35P0000 female at [redacted]w[redacted]d with an Estimated Date of Delivery: 03/04/23 being seen today for ongoing management of a low-risk pregnancy.  Today she reports  doing well; gets third PCN dose tomorrow to complete syphilis treatment . Contractions: Not present. Vag. Bleeding: None.  Movement: Absent. denies leaking of fluid. Review of Systems:   Pertinent items are noted in HPI Denies abnormal vaginal discharge w/ itching/odor/irritation, headaches, visual changes, shortness of breath, chest pain, abdominal pain, severe nausea/vomiting, or problems with urination or bowel movements unless otherwise stated above. Pertinent History Reviewed:  Reviewed past medical,surgical, social, obstetrical and family history.  Reviewed problem list, medications and allergies. Physical Assessment:   Vitals:   09/15/22 1027  BP: 114/81  Pulse: 74  Weight: 260 lb (117.9 kg)  Body mass index is 39.53 kg/m.        Physical Examination:   General appearance: Well appearing, and in no distress  Mental status: Alert, oriented to person, place, and time  Skin: Warm & dry  Cardiovascular: Normal heart rate noted  Respiratory: Normal respiratory effort, no distress  Abdomen: Soft, gravid, nontender  Pelvic: Cervical exam deferred         Extremities: Edema: None  Fetal Status: Fetal Heart Rate (bpm): 149   Movement: Absent    No results found for this or any previous visit (from the past 24 hour(s)).  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  Labs/procedures today: none  Plan:  Continue routine obstetrical care   Reviewed: Preterm labor symptoms and general obstetric precautions including but not limited to vaginal bleeding, contractions, leaking of fluid and fetal movement were reviewed in detail with the patient.  All questions were answered. Has home bp cuff. Check bp weekly, let us know if >140/90.   Follow-up: Return for As scheduled. (Anatomy u/s & 2nd IT)  No orders of the defined types were placed in this encounter.  Myrtis Ser CNM 09/15/2022 10:38 AM

## 2022-09-16 ENCOUNTER — Ambulatory Visit (INDEPENDENT_AMBULATORY_CARE_PROVIDER_SITE_OTHER): Payer: Self-pay | Admitting: General Practice

## 2022-09-16 ENCOUNTER — Other Ambulatory Visit: Payer: Self-pay

## 2022-09-16 VITALS — BP 130/86 | HR 74 | Ht 67.0 in | Wt 256.0 lb

## 2022-09-16 DIAGNOSIS — A510 Primary genital syphilis: Secondary | ICD-10-CM

## 2022-09-16 MED ORDER — PENICILLIN G BENZATHINE 1200000 UNIT/2ML IM SUSY
2.4000 10*6.[IU] | PREFILLED_SYRINGE | Freq: Once | INTRAMUSCULAR | Status: AC
  Administered 2022-09-16: 2.4 10*6.[IU] via INTRAMUSCULAR

## 2022-09-16 NOTE — Progress Notes (Signed)
Golden Hurter here for  Bicillin #3   Injection.  Injection administered without complication. Patient will follow up at [facility] office for [facility] care.  Derinda Late, RN 09/16/2022  10:20 AM

## 2022-09-17 ENCOUNTER — Inpatient Hospital Stay: Payer: Medicaid Other | Attending: Obstetrics and Gynecology | Admitting: Hematology and Oncology

## 2022-09-17 VITALS — BP 92/69 | Wt 257.1 lb

## 2022-09-17 DIAGNOSIS — N644 Mastodynia: Secondary | ICD-10-CM

## 2022-09-17 NOTE — Progress Notes (Signed)
[name] is a 26 y.o. female who presents to [facility] clinic today with complaint of bilateral nipple pain/burning.    Pap Smear: Pap not smear completed today. Last Pap smear was 08/20/22 at [facility] clinic and was abnormal - ASCUS/ HPV - . Per patient has no history of an abnormal Pap smear. Last Pap smear result is not available in Epic.   Physical exam: Breasts Breasts symmetrical. No skin abnormalities bilateral breasts. No nipple retraction bilateral breasts. No nipple discharge bilateral breasts. No lymphadenopathy. No lumps palpated bilateral breasts.        Pelvic/Bimanual Pap is not indicated today    Smoking History: Patient has never smoked and was not referred to quit line.    Patient Navigation: Patient education provided. Access to services provided for patient through [facility] program. No interpreter provided. No transportation provided   Colorectal Cancer Screening: Per patient has never had colonoscopy completed No complaints today.    Breast and Cervical Cancer Risk Assessment: Patient does not have family history of breast cancer, known genetic mutations, or radiation treatment to the chest before age 67. Patient does not have history of cervical dysplasia, immunocompromised, or DES exposure in-utero.  Risk Assessment   No risk assessment data     A: BCCCP exam without pap smear Complaint of bilateral nipple pain/ burning. Benign exam. [redacted] weeks pregnant.   P: [facility]  . Appointment scheduled 09/21/22.  Dayton Scrape A, NP 09/17/2022 11:09 AM

## 2022-09-21 ENCOUNTER — Ambulatory Visit (HOSPITAL_COMMUNITY): Admission: RE | Admit: 2022-09-21 | Payer: Medicaid Other | Source: Ambulatory Visit

## 2022-10-15 ENCOUNTER — Ambulatory Visit (INDEPENDENT_AMBULATORY_CARE_PROVIDER_SITE_OTHER): Payer: Medicaid Other

## 2022-10-15 ENCOUNTER — Encounter: Payer: Medicaid Other | Admitting: Obstetrics & Gynecology

## 2022-10-15 DIAGNOSIS — Z363 Encounter for antenatal screening for malformations: Secondary | ICD-10-CM | POA: Diagnosis not present

## 2022-10-15 NOTE — Progress Notes (Signed)
[place] 20 wks,cephalic,cx 3.2 cm,posterior placenta gr 0,normal right ovary,left ovary not visualized, SVP of fluid 4.4 cm,FHR 166 bpm,EFW 363 g 77%,anatomy complete,no obvious abnormalities

## 2022-10-18 ENCOUNTER — Ambulatory Visit (INDEPENDENT_AMBULATORY_CARE_PROVIDER_SITE_OTHER): Payer: Medicaid Other | Admitting: Obstetrics & Gynecology

## 2022-10-18 ENCOUNTER — Encounter: Payer: Self-pay | Admitting: Obstetrics & Gynecology

## 2022-10-18 VITALS — BP 116/76 | HR 76 | Wt 261.8 lb

## 2022-10-18 DIAGNOSIS — Z3402 Encounter for supervision of normal first pregnancy, second trimester: Secondary | ICD-10-CM

## 2022-10-18 DIAGNOSIS — A510 Primary genital syphilis: Secondary | ICD-10-CM

## 2022-10-18 DIAGNOSIS — Z3A20 20 weeks gestation of pregnancy: Secondary | ICD-10-CM

## 2022-10-18 DIAGNOSIS — O98119 Syphilis complicating pregnancy, unspecified trimester: Secondary | ICD-10-CM

## 2022-10-18 NOTE — Progress Notes (Signed)
   LOW-RISK PREGNANCY VISIT Patient name: Alejandra Diaz MRN [id number]  Date of birth: 1996/06/11 Chief Complaint:   Routine Prenatal Visit  History of Present Illness:   Alejandra Diaz is a 26 y.o. G71P0000 female at [redacted]w[redacted]d with an Estimated Date of Delivery: 03/04/23 being seen today for ongoing management of a low-risk pregnancy.   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 Contractions: Not present. Vag. Bleeding: None.  Movement: Absent. denies leaking of fluid. Review of Systems:   Pertinent items are noted in HPI Denies abnormal vaginal discharge w/ itching/odor/irritation, headaches, visual changes, shortness of breath, chest pain, abdominal pain, severe nausea/vomiting, or problems with urination or bowel movements unless otherwise stated above. Pertinent History Reviewed:  Reviewed past medical,surgical, social, obstetrical and family history.  Reviewed problem list, medications and allergies.  Physical Assessment:   Vitals:   10/18/22 1337  BP: 116/76  Pulse: 76  Weight: 261 lb 12.8 oz (118.8 kg)  Body mass index is 41 kg/m.        Physical Examination:   General appearance: Well appearing, and in no distress  Mental status: Alert, oriented to person, place, and time  Skin: Warm & dry  Respiratory: Normal respiratory effort, no distress  Abdomen: Soft, gravid, nontender  Pelvic: Cervical exam deferred         Extremities: Edema: None  Psych:  mood and affect appropriate  Fetal Status:     Movement: Absent  [place] 20 wks,cephalic,cx 3.2 cm,posterior placenta gr 0,normal right ovary,left ovary not  visualized, SVP of fluid 4.4 cm,FHR 166 bpm,EFW 363 g 77%,anatomy complete,no obvious abnormalities   Chaperone: n/a    No results found for this or any previous visit (from the past 24 hour(s)).   Assessment & Plan:  1) Low-risk pregnancy G1P0000 at [redacted]w[redacted]d with an Estimated Date of Delivery: 03/04/23   Anatomy scan completed today- normal findings   Meds: No orders of the defined types were placed in this encounter.  Labs/procedures today: anatomy scan, AFP testing  Plan:  Continue routine obstetrical care  Next visit: prefers in person    Reviewed: Preterm labor symptoms and general obstetric precautions including but not limited to vaginal bleeding, contractions, leaking of fluid and fetal movement were reviewed in detail with the patient.  All questions were answered. Pt has home bp cuff. Check bp weekly, let us know if >140/90.   Follow-up: Return in about 4 weeks (around 11/15/2022) for [place] visit.  Orders Placed This Encounter  Procedures   AFP, Serum, Open Spina Bifida    Janyth Pupa, DO Attending [place], [place] for [employer], [place]

## 2022-11-15 ENCOUNTER — Ambulatory Visit (INDEPENDENT_AMBULATORY_CARE_PROVIDER_SITE_OTHER): Payer: Medicaid Other | Admitting: Advanced Practice Midwife

## 2022-11-15 VITALS — BP 104/65 | HR 73 | Wt 266.0 lb

## 2022-11-15 DIAGNOSIS — Z3402 Encounter for supervision of normal first pregnancy, second trimester: Secondary | ICD-10-CM

## 2022-11-15 DIAGNOSIS — Z3A24 24 weeks gestation of pregnancy: Secondary | ICD-10-CM

## 2022-11-15 NOTE — Patient Instructions (Signed)

## 2022-11-15 NOTE — Progress Notes (Signed)
    LOW-RISK PREGNANCY VISIT Patient name: Alejandra Diaz MRN [id number]  Date of birth: [birth year] Chief Complaint:   Routine Prenatal Visit  History of Present Illness:   Alejandra Diaz is a 26 y.o. G14P0000 female at [redacted]w[redacted]d with an Estimated Date of Delivery: 03/04/23 being seen today for ongoing management of a low-risk pregnancy.   Today she reports no complaints. Contractions: Not present. Vag. Bleeding: None.  Movement: Absent. denies leaking of fluid.     08/20/2022   10:24 AM  Depression screen PHQ 2/9  Decreased Interest 2  Down, Depressed, Hopeless 1  PHQ - 2 Score 3  Altered sleeping 0  Tired, decreased energy 3  Change in appetite 1  Feeling bad or failure about yourself  0  Trouble concentrating 0  Moving slowly or fidgety/restless 0  Suicidal thoughts 0  PHQ-9 Score 7        08/20/2022   10:25 AM  GAD 7 : Generalized Anxiety Score  Nervous, Anxious, on Edge 1  Control/stop worrying 0  Worry too much - different things 0  Trouble relaxing 0  Restless 0  Easily annoyed or irritable 3  Afraid - awful might happen 1  Total GAD 7 Score 5      Review of Systems:   Pertinent items are noted in HPI Denies abnormal vaginal discharge w/ itching/odor/irritation, headaches, visual changes, shortness of breath, chest pain, abdominal pain, severe nausea/vomiting, or problems with urination or bowel movements unless otherwise stated above. Pertinent History Reviewed:  Reviewed past medical,surgical, social, obstetrical and family history.  Reviewed problem list, medications and allergies. Physical Assessment:   Vitals:   11/15/22 0837  BP: 104/65  Pulse: 73  Weight: 266 lb (120.7 kg)  Body mass index is 41.66 kg/m.        Physical Examination:   General appearance: Well appearing, and in no distress  Mental status: Alert, oriented to person, place, and time  Skin: Warm & dry  Cardiovascular: Normal heart rate noted  Respiratory: Normal respiratory effort, no  distress  Abdomen: Soft, gravid, nontender  Pelvic: Cervical exam deferred         Extremities: Edema: None  Fetal Status: Fetal Heart Rate (bpm): 152 Fundal Height: 25 cm Movement: Absent    Chaperone: N/A   No results found for this or any previous visit (from the past 24 hour(s)).  Assessment & Plan:  1) Low-risk pregnancy G1P0000 at [redacted]w[redacted]d with an Estimated Date of Delivery: 03/04/23      Meds: No orders of the defined types were placed in this encounter.  Labs/procedures today: none  Plan:  Continue routine obstetrical care  Next visit: prefers in person    Reviewed: Preterm labor symptoms and general obstetric precautions including but not limited to vaginal bleeding, contractions, leaking of fluid and fetal movement were reviewed in detail with the patient.  All questions were answered. Does have home bp cuff. Office bp cuff given: not applicable. Check bp weekly, let us know if consistently >140 and/or >90.  Follow-up: Return in about 3 weeks (around 12/06/2022) for PN2/LROB.  Future Appointments  Date Time Provider Department Center  12/06/2022  8:30 AM [facility]  12/06/2022  9:10 AM Cheral Marker, CNM [facility]    No orders of the defined types were placed in this encounter.  Scarlette Calico Cresenzo-Dishmon CNM 11/15/2022 11:01 AM

## 2022-12-06 ENCOUNTER — Other Ambulatory Visit: Payer: Medicaid Other

## 2022-12-06 ENCOUNTER — Ambulatory Visit (INDEPENDENT_AMBULATORY_CARE_PROVIDER_SITE_OTHER): Payer: Medicaid Other | Admitting: Women's Health

## 2022-12-06 ENCOUNTER — Encounter: Payer: Self-pay | Admitting: Women's Health

## 2022-12-06 VITALS — BP 111/64 | HR 76 | Wt 262.2 lb

## 2022-12-06 DIAGNOSIS — Z3A27 27 weeks gestation of pregnancy: Secondary | ICD-10-CM

## 2022-12-06 DIAGNOSIS — Z3402 Encounter for supervision of normal first pregnancy, second trimester: Secondary | ICD-10-CM

## 2022-12-06 DIAGNOSIS — Z131 Encounter for screening for diabetes mellitus: Secondary | ICD-10-CM | POA: Diagnosis not present

## 2022-12-06 NOTE — Patient Instructions (Signed)
Alejandra Diaz, thank you for choosing our office today! We appreciate the opportunity to meet your healthcare needs. You may receive a short survey by mail, e-mail, or through [employer]. If you are happy with your care we would appreciate if you could take just a few minutes to complete the survey questions. We read all of your comments and take your feedback very seriously. Thank you again for choosing our office.  Center for [employer] Team at [place] at [facility] ([place], [place] [address]) Entrance C, located off of [address] parking   CLASSES: Go to [email or web address] to register for classes (childbirth, breastfeeding, waterbirth, infant CPR, daddy bootcamp, etc.)  Call the office [phone]) or go to [facility] if: You begin to have strong, frequent contractions Your water breaks.  Sometimes it is a big gush of fluid, sometimes it is just a trickle that keeps getting your panties wet or running down your legs You have vaginal bleeding.  It is normal to have a small amount of spotting if your cervix was checked.  You don't feel your baby moving like normal.  If you don't, get you something to eat and drink and lay down and focus on feeling your baby move.   If your baby is still not moving like normal, you should call the office or go to [facility].  Call the office [phone]) or go to [facility] hospital for these signs of pre-eclampsia: Severe headache that does not go away with Tylenol Visual changes- seeing spots, double, blurred vision Pain under your right breast or upper abdomen that does not go away with Tums or heartburn medicine Nausea and/or vomiting Severe swelling in your hands, feet, and face   Tdap Vaccine It is recommended that you get the Tdap vaccine during the third trimester of EACH pregnancy to help protect your baby from getting pertussis (whooping cough) 27-36 weeks is the BEST time to do  this so that you can pass the protection on to your baby. During pregnancy is better than after pregnancy, but if you are unable to get it during pregnancy it will be offered at the hospital.  You can get this vaccine with [place], at the health department, your family doctor, or some local pharmacies Everyone who will be around your baby should also be up-to-date on their vaccines before the baby comes. Adults (who are not pregnant) only need 1 dose of Tdap during adulthood.   [facility] Pediatricians/Family Doctors [facility]): [address] [place], [place] [facility]: [address] [place], [phone]                [facility]): [place], [phone] (call to ask if accepting patients) [facility] Department: [address], [place], [place] Pediatricians/Family Doctors [facility]): [place]. [place], [address], [place] [facility]: [address] [place], [facility]: [place], [place] [facility]): [phone] [facility]: [address] [place]: [address] [facility]: [phone], [phone]  Home Blood Pressure Monitoring for Patients   Your provider has recommended that you check your  blood pressure (BP) at least once a week at home. If you do not have a blood pressure cuff at home, one will be provided for you. Contact your provider if you have not received your monitor within 1 week.   Helpful Tips for Accurate Home Blood Pressure Checks  Don't smoke, exercise, or drink caffeine 30 minutes before checking your BP Use the restroom before checking your BP (a full bladder can raise your  pressure) Relax in a comfortable upright chair Feet on the ground Left arm resting comfortably on a flat surface at the level of your heart Legs uncrossed Back supported Sit quietly and don't talk Place the cuff on your bare arm Adjust snuggly, so that only two fingertips can fit between your skin and the top of the cuff Check 2 readings separated by at least one minute Keep a log of your BP readings For a visual, please reference this diagram: http://ccnc.care/bpdiagram  Provider Name: [facility]     Phone: [phone]  Zone 1: ALL CLEAR  Continue to monitor your symptoms:  BP reading is less than 140 (top number) or less than 90 (bottom number)  No right upper stomach pain No headaches or seeing spots No feeling nauseated or throwing up No swelling in face and hands  Zone 2: CAUTION Call your doctor's office for any of the following:  BP reading is greater than 140 (top number) or greater than 90 (bottom number)  Stomach pain under your ribs in the middle or right side Headaches or seeing spots Feeling nauseated or throwing up Swelling in face and hands  Zone 3: EMERGENCY  Seek immediate medical care if you have any of the following:  BP reading is greater than160 (top number) or greater than 110 (bottom number) Severe headaches not improving with Tylenol Serious difficulty catching your breath Any worsening symptoms from Zone 2   Third Trimester of Pregnancy The third trimester is from week 29 through week 42, months 7 through 9. The third trimester is a time when the fetus is growing rapidly. At the end of the ninth month, the fetus is about 20 inches in length and weighs 6-10 pounds.  BODY CHANGES Your body goes through many changes during pregnancy. The changes vary from woman to woman.  Your weight will continue to increase. You can expect to gain 25-35 pounds (11-16 kg) by the end of the pregnancy. You may begin to get stretch marks on your hips, abdomen,  and breasts. You may urinate more often because the fetus is moving lower into your pelvis and pressing on your bladder. You may develop or continue to have heartburn as a result of your pregnancy. You may develop constipation because certain hormones are causing the muscles that push waste through your intestines to slow down. You may develop hemorrhoids or swollen, bulging veins (varicose veins). You may have pelvic pain because of the weight gain and pregnancy hormones relaxing your joints between the bones in your pelvis. Backaches may result from overexertion of the muscles supporting your posture. You may have changes in your hair. These can include thickening of your hair, rapid growth, and changes in texture. Some women also have hair loss during or after pregnancy, or hair that feels dry or thin. Your hair will most likely return to normal after your baby is born. Your breasts will continue to grow and be tender. A yellow discharge may leak from your breasts called colostrum. Your belly button may stick out. You may   feel short of breath because of your expanding uterus. You may notice the fetus "dropping," or moving lower in your abdomen. You may have a bloody mucus discharge. This usually occurs a few days to a week before labor begins. Your cervix becomes thin and soft (effaced) near your due date. WHAT TO EXPECT AT YOUR PRENATAL EXAMS  You will have prenatal exams every 2 weeks until week 36. Then, you will have weekly prenatal exams. During a routine prenatal visit: You will be weighed to make sure you and the fetus are growing normally. Your blood pressure is taken. Your abdomen will be measured to track your baby's growth. The fetal heartbeat will be listened to. Any test results from the previous visit will be discussed. You may have a cervical check near your due date to see if you have effaced. At around 36 weeks, your caregiver will check your cervix. At the same time, your  caregiver will also perform a test on the secretions of the vaginal tissue. This test is to determine if a type of bacteria, Group B streptococcus, is present. Your caregiver will explain this further. Your caregiver may ask you: What your birth plan is. How you are feeling. If you are feeling the baby move. If you have had any abnormal symptoms, such as leaking fluid, bleeding, severe headaches, or abdominal cramping. If you have any questions. Other tests or screenings that may be performed during your third trimester include: Blood tests that check for low iron levels (anemia). Fetal testing to check the health, activity level, and growth of the fetus. Testing is done if you have certain medical conditions or if there are problems during the pregnancy. FALSE LABOR You may feel small, irregular contractions that eventually go away. These are called Braxton Hicks contractions, or false labor. Contractions may last for hours, days, or even weeks before true labor sets in. If contractions come at regular intervals, intensify, or become painful, it is best to be seen by your caregiver.  SIGNS OF LABOR  Menstrual-like cramps. Contractions that are 5 minutes apart or less. Contractions that start on the top of the uterus and spread down to the lower abdomen and back. A sense of increased pelvic pressure or back pain. A watery or bloody mucus discharge that comes from the vagina. If you have any of these signs before the 37th week of pregnancy, call your caregiver right away. You need to go to the hospital to get checked immediately. HOME CARE INSTRUCTIONS  Avoid all smoking, herbs, alcohol, and unprescribed drugs. These chemicals affect the formation and growth of the baby. Follow your caregiver's instructions regarding medicine use. There are medicines that are either safe or unsafe to take during pregnancy. Exercise only as directed by your caregiver. Experiencing uterine cramps is a good sign to  stop exercising. Continue to eat regular, healthy meals. Wear a good support bra for breast tenderness. Do not use hot tubs, steam rooms, or saunas. Wear your seat belt at all times when driving. Avoid raw meat, uncooked cheese, cat litter boxes, and soil used by cats. These carry germs that can cause birth defects in the baby. Take your prenatal vitamins. Try taking a stool softener (if your caregiver approves) if you develop constipation. Eat more high-fiber foods, such as fresh vegetables or fruit and whole grains. Drink plenty of fluids to keep your urine clear or pale yellow. Take warm sitz baths to soothe any pain or discomfort caused by hemorrhoids. Use hemorrhoid cream if   your caregiver approves. If you develop varicose veins, wear support hose. Elevate your feet for 15 minutes, 3-4 times a day. Limit salt in your diet. Avoid heavy lifting, wear low heal shoes, and practice good posture. Rest a lot with your legs elevated if you have leg cramps or low back pain. Visit your dentist if you have not gone during your pregnancy. Use a soft toothbrush to brush your teeth and be gentle when you floss. A sexual relationship may be continued unless your caregiver directs you otherwise. Do not travel far distances unless it is absolutely necessary and only with the approval of your caregiver. Take prenatal classes to understand, practice, and ask questions about the labor and delivery. Make a trial run to the hospital. Pack your hospital bag. Prepare the baby's nursery. Continue to go to all your prenatal visits as directed by your caregiver. SEEK MEDICAL CARE IF: You are unsure if you are in labor or if your water has broken. You have dizziness. You have mild pelvic cramps, pelvic pressure, or nagging pain in your abdominal area. You have persistent nausea, vomiting, or diarrhea. You have a bad smelling vaginal discharge. You have pain with urination. SEEK IMMEDIATE MEDICAL CARE IF:  You  have a fever. You are leaking fluid from your vagina. You have spotting or bleeding from your vagina. You have severe abdominal cramping or pain. You have rapid weight loss or gain. You have shortness of breath with chest pain. You notice sudden or extreme swelling of your face, hands, ankles, feet, or legs. You have not felt your baby move in over an hour. You have severe headaches that do not go away with medicine. You have vision changes. Document Released: 11/30/2001 Document Revised: 12/11/2013 Document Reviewed: 02/06/2013 ExitCare Patient Information 2015 [employer]. This information is not intended to replace advice given to you by your health care provider. Make sure you discuss any questions you have with your health care provider.       

## 2022-12-06 NOTE — Progress Notes (Signed)
LOW-RISK PREGNANCY VISIT Patient name: Alejandra Diaz MRN [id number]  Date of birth: 05/02/1996 Chief Complaint:   Routine Prenatal Visit  History of Present Illness:   Alejandra Diaz is a 26 y.o. 511P0000 female at [redacted]w[redacted]d with an Estimated Date of Delivery: 03/04/23 being seen today for ongoing management of a low-risk pregnancy.   Today she reports no complaints. Contractions: Not present. Vag. Bleeding: None.  Movement: Present. denies leaking of fluid.     08/20/2022   10:24 AM  Depression screen PHQ 2/9  Decreased Interest 2  Down, Depressed, Hopeless 1  PHQ - 2 Score 3  Altered sleeping 0  Tired, decreased energy 3  Change in appetite 1  Feeling bad or failure about yourself  0  Trouble concentrating 0  Moving slowly or fidgety/restless 0  Suicidal thoughts 0  PHQ-9 Score 7        08/20/2022   10:25 AM  GAD 7 : Generalized Anxiety Score  Nervous, Anxious, on Edge 1  Control/stop worrying 0  Worry too much - different things 0  Trouble relaxing 0  Restless 0  Easily annoyed or irritable 3  Afraid - awful might happen 1  Total GAD 7 Score 5      Review of Systems:   Pertinent items are noted in HPI Denies abnormal vaginal discharge w/ itching/odor/irritation, headaches, visual changes, shortness of breath, chest pain, abdominal pain, severe nausea/vomiting, or problems with urination or bowel movements unless otherwise stated above. Pertinent History Reviewed:  Reviewed past medical,surgical, social, obstetrical and family history.  Reviewed problem list, medications and allergies. Physical Assessment:   Vitals:   12/06/22 0848  BP: 111/64  Pulse: 76  Weight: 262 lb 4 oz (119 kg)  Body mass index is 41.07 kg/m.        Physical Examination:   General appearance: Well appearing, and in no distress  Mental status: Alert, oriented to person, place, and time  Skin: Warm & dry  Cardiovascular: Normal heart rate noted  Respiratory: Normal respiratory effort, no  distress  Abdomen: Soft, gravid, nontender  Pelvic: Cervical exam deferred         Extremities: Edema: None  Fetal Status: Fetal Heart Rate (bpm): 146 Fundal Height: 28 cm Movement: Present    Chaperone: N/A   No results found for this or any previous visit (from the past 24 hour(s)).  Assessment & Plan:  1) Low-risk pregnancy G1P0000 at [redacted]w[redacted]d with an Estimated Date of Delivery: 03/04/23   2) RPR earlier pregnancy, s/p Bicillin x 3, states partner had bicillin x 2 then doxy x 14d. Repeating titer today w/ PN2   Meds: No orders of the defined types were placed in this encounter.  Labs/procedures today: PN2, declined flu shot, and declined tdap  Plan:  Continue routine obstetrical care  Next visit: prefers in person    Reviewed: Preterm labor symptoms and general obstetric precautions including but not limited to vaginal bleeding, contractions, leaking of fluid and fetal movement were reviewed in detail with the patient.  All questions were answered. Does have home bp cuff. Office bp cuff given: not applicable. Check bp weekly, let us know if consistently >140 and/or >90.  Follow-up: Return in about 3 weeks (around 12/27/2022) for LROB, CNM, in person.  Future Appointments  Date Time Provider Department Center  12/27/2022  8:50 AM Cheral MarkerBooker, Eevee Borbon R, CNM [facility]    No orders of the defined types were placed in this encounter.  Cheral MarkerKimberly R Eugenie Harewood CNM,  WHNP-BC 12/06/2022 9:10 AM

## 2022-12-08 LAB — CBC
Hematocrit: 38 % (ref 34.0–46.6)
Hemoglobin: 12 g/dL (ref 11.1–15.9)
MCH: 25.3 pg — ABNORMAL LOW (ref 26.6–33.0)
MCHC: 31.6 g/dL (ref 31.5–35.7)
MCV: 80 fL (ref 79–97)
Platelets: 242 10*3/uL (ref 150–450)
RBC: 4.75 x10E6/uL (ref 3.77–5.28)
RDW: 14.3 % (ref 11.7–15.4)
WBC: 6.5 10*3/uL (ref 3.4–10.8)

## 2022-12-08 LAB — HIV ANTIBODY (ROUTINE TESTING W REFLEX): HIV Screen 4th Generation wRfx: NONREACTIVE

## 2022-12-08 LAB — GLUCOSE TOLERANCE, 2 HOURS W/ 1HR
Glucose, 1 hour: 131 mg/dL (ref 70–179)
Glucose, 2 hour: 88 mg/dL (ref 70–152)
Glucose, Fasting: 74 mg/dL (ref 70–91)

## 2022-12-08 LAB — RPR, QUANT+TP ABS (REFLEX)
Rapid Plasma Reagin, Quant: 1:4 {titer} — ABNORMAL HIGH
T Pallidum Abs: REACTIVE — AB

## 2022-12-08 LAB — ANTIBODY SCREEN: Antibody Screen: NEGATIVE

## 2022-12-08 LAB — RPR: RPR Ser Ql: REACTIVE — AB

## 2022-12-20 NOTE — L&D Delivery Note (Signed)
OB/GYN Faculty Practice Delivery Note  Alejandra Diaz is a 27 y.o. G1P0000 s/p VD at [redacted]w[redacted]d. She was admitted for IOL 2/2 PD, obesity.   ROM: 16h 42m with light meconium stained fluid GBS Status:  Positive/-- (02/22 1100) Maximum Maternal Temperature: 99.1  Labor Progress: Initial SVE: 1/70/-3. She then progressed to complete.   Delivery Date/Time: 3/19/20204 @0300  Delivery: Called to room and patient was complete and pushing. Head delivered ROA. No nuchal cord present. Shoulder and body delivered in usual fashion. Infant with spontaneous cry, placed on mother's abdomen, dried and stimulated. Cord clamped x 2 after 1-minute delay, and cut by FOB. Cord blood drawn. Placenta delivered spontaneously with gentle cord traction. Fundus firm with massage and Pitocin. Labia, perineum, vagina, and cervix inspected. She had a sulcus and right labial laceration that was repaired with 2.0 vicryl and 3.0 monocryl respectfully.  Baby Weight: pending  Placenta: 3 vessel, intact. Sent to pathology Complications: None Lacerations: See above.  EBL: 515 mL Analgesia: Epidural   Infant:  APGAR (1 MIN): 8   APGAR (5 MINS): 9    Alejandra Kassel Autry-Lott, DO OB Fellow, [place] for [employer] 03/08/2023, 3:44 AM

## 2022-12-27 ENCOUNTER — Encounter: Payer: Self-pay | Admitting: Women's Health

## 2022-12-27 ENCOUNTER — Ambulatory Visit (INDEPENDENT_AMBULATORY_CARE_PROVIDER_SITE_OTHER): Payer: Medicaid Other | Admitting: Women's Health

## 2022-12-27 VITALS — BP 104/64 | HR 75 | Wt 266.0 lb

## 2022-12-27 DIAGNOSIS — Z3403 Encounter for supervision of normal first pregnancy, third trimester: Secondary | ICD-10-CM

## 2022-12-27 NOTE — Patient Instructions (Signed)
Alejandra Diaz, thank you for choosing our office today! We appreciate the opportunity to meet your healthcare needs. You may receive a short survey by mail, e-mail, or through [employer]. If you are happy with your care we would appreciate if you could take just a few minutes to complete the survey questions. We read all of your comments and take your feedback very seriously. Thank you again for choosing our office.  Center for [employer] Team at [place] at [facility] ([place], [place] [address]) Entrance C, located off of [address] parking   CLASSES: Go to [email or web address] to register for classes (childbirth, breastfeeding, waterbirth, infant CPR, daddy bootcamp, etc.)  Call the office [phone]) or go to [facility] if: You begin to have strong, frequent contractions Your water breaks.  Sometimes it is a big gush of fluid, sometimes it is just a trickle that keeps getting your panties wet or running down your legs You have vaginal bleeding.  It is normal to have a small amount of spotting if your cervix was checked.  You don't feel your baby moving like normal.  If you don't, get you something to eat and drink and lay down and focus on feeling your baby move.   If your baby is still not moving like normal, you should call the office or go to [facility].  Call the office [phone]) or go to [facility] hospital for these signs of pre-eclampsia: Severe headache that does not go away with Tylenol Visual changes- seeing spots, double, blurred vision Pain under your right breast or upper abdomen that does not go away with Tums or heartburn medicine Nausea and/or vomiting Severe swelling in your hands, feet, and face   Tdap Vaccine It is recommended that you get the Tdap vaccine during the third trimester of EACH pregnancy to help protect your baby from getting pertussis (whooping cough) 27-36 weeks is the BEST time to do  this so that you can pass the protection on to your baby. During pregnancy is better than after pregnancy, but if you are unable to get it during pregnancy it will be offered at the hospital.  You can get this vaccine with [place], at the health department, your family doctor, or some local pharmacies Everyone who will be around your baby should also be up-to-date on their vaccines before the baby comes. Adults (who are not pregnant) only need 1 dose of Tdap during adulthood.   [facility] Pediatricians/Family Doctors [facility]): [address] [place], [place] [facility]: [address] [place], [phone]                [facility]): [place], [phone] (call to ask if accepting patients) [facility] Department: [address], [place], [place] Pediatricians/Family Doctors [facility]): [place]. [place], [address], [place] [facility]: [address] [place], [facility]: [place], [place] [facility]): [phone] [facility]: [address] [place]: [address] [facility]: [phone], [phone]  Home Blood Pressure Monitoring for Patients   Your provider has recommended that you check your  blood pressure (BP) at least once a week at home. If you do not have a blood pressure cuff at home, one will be provided for you. Contact your provider if you have not received your monitor within 1 week.   Helpful Tips for Accurate Home Blood Pressure Checks  Don't smoke, exercise, or drink caffeine 30 minutes before checking your BP Use the restroom before checking your BP (a full bladder can raise your  pressure) Relax in a comfortable upright chair Feet on the ground Left arm resting comfortably on a flat surface at the level of your heart Legs uncrossed Back supported Sit quietly and don't talk Place the cuff on your bare arm Adjust snuggly, so that only two fingertips can fit between your skin and the top of the cuff Check 2 readings separated by at least one minute Keep a log of your BP readings For a visual, please reference this diagram: http://ccnc.care/bpdiagram  Provider Name: [facility]     Phone: [phone]  Zone 1: ALL CLEAR  Continue to monitor your symptoms:  BP reading is less than 140 (top number) or less than 90 (bottom number)  No right upper stomach pain No headaches or seeing spots No feeling nauseated or throwing up No swelling in face and hands  Zone 2: CAUTION Call your doctor's office for any of the following:  BP reading is greater than 140 (top number) or greater than 90 (bottom number)  Stomach pain under your ribs in the middle or right side Headaches or seeing spots Feeling nauseated or throwing up Swelling in face and hands  Zone 3: EMERGENCY  Seek immediate medical care if you have any of the following:  BP reading is greater than160 (top number) or greater than 110 (bottom number) Severe headaches not improving with Tylenol Serious difficulty catching your breath Any worsening symptoms from Zone 2   Third Trimester of Pregnancy The third trimester is from week 29 through week 42, months 7 through 9. The third trimester is a time when the fetus is growing rapidly. At the end of the ninth month, the fetus is about 20 inches in length and weighs 6-10 pounds.  BODY CHANGES Your body goes through many changes during pregnancy. The changes vary from woman to woman.  Your weight will continue to increase. You can expect to gain 25-35 pounds (11-16 kg) by the end of the pregnancy. You may begin to get stretch marks on your hips, abdomen,  and breasts. You may urinate more often because the fetus is moving lower into your pelvis and pressing on your bladder. You may develop or continue to have heartburn as a result of your pregnancy. You may develop constipation because certain hormones are causing the muscles that push waste through your intestines to slow down. You may develop hemorrhoids or swollen, bulging veins (varicose veins). You may have pelvic pain because of the weight gain and pregnancy hormones relaxing your joints between the bones in your pelvis. Backaches may result from overexertion of the muscles supporting your posture. You may have changes in your hair. These can include thickening of your hair, rapid growth, and changes in texture. Some women also have hair loss during or after pregnancy, or hair that feels dry or thin. Your hair will most likely return to normal after your baby is born. Your breasts will continue to grow and be tender. A yellow discharge may leak from your breasts called colostrum. Your belly button may stick out. You may   feel short of breath because of your expanding uterus. You may notice the fetus "dropping," or moving lower in your abdomen. You may have a bloody mucus discharge. This usually occurs a few days to a week before labor begins. Your cervix becomes thin and soft (effaced) near your due date. WHAT TO EXPECT AT YOUR PRENATAL EXAMS  You will have prenatal exams every 2 weeks until week 36. Then, you will have weekly prenatal exams. During a routine prenatal visit: You will be weighed to make sure you and the fetus are growing normally. Your blood pressure is taken. Your abdomen will be measured to track your baby's growth. The fetal heartbeat will be listened to. Any test results from the previous visit will be discussed. You may have a cervical check near your due date to see if you have effaced. At around 36 weeks, your caregiver will check your cervix. At the same time, your  caregiver will also perform a test on the secretions of the vaginal tissue. This test is to determine if a type of bacteria, Group B streptococcus, is present. Your caregiver will explain this further. Your caregiver may ask you: What your birth plan is. How you are feeling. If you are feeling the baby move. If you have had any abnormal symptoms, such as leaking fluid, bleeding, severe headaches, or abdominal cramping. If you have any questions. Other tests or screenings that may be performed during your third trimester include: Blood tests that check for low iron levels (anemia). Fetal testing to check the health, activity level, and growth of the fetus. Testing is done if you have certain medical conditions or if there are problems during the pregnancy. FALSE LABOR You may feel small, irregular contractions that eventually go away. These are called Braxton Hicks contractions, or false labor. Contractions may last for hours, days, or even weeks before true labor sets in. If contractions come at regular intervals, intensify, or become painful, it is best to be seen by your caregiver.  SIGNS OF LABOR  Menstrual-like cramps. Contractions that are 5 minutes apart or less. Contractions that start on the top of the uterus and spread down to the lower abdomen and back. A sense of increased pelvic pressure or back pain. A watery or bloody mucus discharge that comes from the vagina. If you have any of these signs before the 37th week of pregnancy, call your caregiver right away. You need to go to the hospital to get checked immediately. HOME CARE INSTRUCTIONS  Avoid all smoking, herbs, alcohol, and unprescribed drugs. These chemicals affect the formation and growth of the baby. Follow your caregiver's instructions regarding medicine use. There are medicines that are either safe or unsafe to take during pregnancy. Exercise only as directed by your caregiver. Experiencing uterine cramps is a good sign to  stop exercising. Continue to eat regular, healthy meals. Wear a good support bra for breast tenderness. Do not use hot tubs, steam rooms, or saunas. Wear your seat belt at all times when driving. Avoid raw meat, uncooked cheese, cat litter boxes, and soil used by cats. These carry germs that can cause birth defects in the baby. Take your prenatal vitamins. Try taking a stool softener (if your caregiver approves) if you develop constipation. Eat more high-fiber foods, such as fresh vegetables or fruit and whole grains. Drink plenty of fluids to keep your urine clear or pale yellow. Take warm sitz baths to soothe any pain or discomfort caused by hemorrhoids. Use hemorrhoid cream if   your caregiver approves. If you develop varicose veins, wear support hose. Elevate your feet for 15 minutes, 3-4 times a day. Limit salt in your diet. Avoid heavy lifting, wear low heal shoes, and practice good posture. Rest a lot with your legs elevated if you have leg cramps or low back pain. Visit your dentist if you have not gone during your pregnancy. Use a soft toothbrush to brush your teeth and be gentle when you floss. A sexual relationship may be continued unless your caregiver directs you otherwise. Do not travel far distances unless it is absolutely necessary and only with the approval of your caregiver. Take prenatal classes to understand, practice, and ask questions about the labor and delivery. Make a trial run to the hospital. Pack your hospital bag. Prepare the baby's nursery. Continue to go to all your prenatal visits as directed by your caregiver. SEEK MEDICAL CARE IF: You are unsure if you are in labor or if your water has broken. You have dizziness. You have mild pelvic cramps, pelvic pressure, or nagging pain in your abdominal area. You have persistent nausea, vomiting, or diarrhea. You have a bad smelling vaginal discharge. You have pain with urination. SEEK IMMEDIATE MEDICAL CARE IF:  You  have a fever. You are leaking fluid from your vagina. You have spotting or bleeding from your vagina. You have severe abdominal cramping or pain. You have rapid weight loss or gain. You have shortness of breath with chest pain. You notice sudden or extreme swelling of your face, hands, ankles, feet, or legs. You have not felt your baby move in over an hour. You have severe headaches that do not go away with medicine. You have vision changes. Document Released: 11/30/2001 Document Revised: 12/11/2013 Document Reviewed: 02/06/2013 ExitCare Patient Information 2015 [employer]. This information is not intended to replace advice given to you by your health care provider. Make sure you discuss any questions you have with your health care provider.       

## 2022-12-27 NOTE — Progress Notes (Signed)
    LOW-RISK PREGNANCY VISIT Patient name: Alejandra Diaz MRN [id number]  Date of birth: [birth year] Chief Complaint:   Routine Prenatal Visit  History of Present Illness:   Alejandra Diaz is a 27 y.o. G31P0000 female at [redacted]w[redacted]d with an Estimated Date of Delivery: 03/04/23 being seen today for ongoing management of a low-risk pregnancy.   Today she reports no complaints. Contractions: Not present. Vag. Bleeding: None.  Movement: Present. denies leaking of fluid.     08/20/2022   10:24 AM  Depression screen PHQ 2/9  Decreased Interest 2  Down, Depressed, Hopeless 1  PHQ - 2 Score 3  Altered sleeping 0  Tired, decreased energy 3  Change in appetite 1  Feeling bad or failure about yourself  0  Trouble concentrating 0  Moving slowly or fidgety/restless 0  Suicidal thoughts 0  PHQ-9 Score 7        08/20/2022   10:25 AM  GAD 7 : Generalized Anxiety Score  Nervous, Anxious, on Edge 1  Control/stop worrying 0  Worry too much - different things 0  Trouble relaxing 0  Restless 0  Easily annoyed or irritable 3  Afraid - awful might happen 1  Total GAD 7 Score 5      Review of Systems:   Pertinent items are noted in HPI Denies abnormal vaginal discharge w/ itching/odor/irritation, headaches, visual changes, shortness of breath, chest pain, abdominal pain, severe nausea/vomiting, or problems with urination or bowel movements unless otherwise stated above. Pertinent History Reviewed:  Reviewed past medical,surgical, social, obstetrical and family history.  Reviewed problem list, medications and allergies. Physical Assessment:   Vitals:   12/27/22 0909  BP: 104/64  Pulse: 75  Weight: 266 lb (120.7 kg)  Body mass index is 41.66 kg/m.        Physical Examination:   General appearance: Well appearing, and in no distress  Mental status: Alert, oriented to person, place, and time  Skin: Warm & dry  Cardiovascular: Normal heart rate noted  Respiratory: Normal respiratory effort, no  distress  Abdomen: Soft, gravid, nontender  Pelvic: Cervical exam deferred         Extremities: Edema: None  Fetal Status: Fetal Heart Rate (bpm): 140 Fundal Height: 31 cm Movement: Present    Chaperone: N/A   No results found for this or any previous visit (from the past 24 hour(s)).  Assessment & Plan:  1) Low-risk pregnancy G1P0000 at [redacted]w[redacted]d with an Estimated Date of Delivery: 03/04/23   2) +RPR earlier this pregnancy, s/p Bicillin x 3, partner had bicillin x 2 then doxy x 14d.    Meds: No orders of the defined types were placed in this encounter.  Labs/procedures today: declined flu shot and declined tdap  Plan:  Continue routine obstetrical care  Next visit: prefers online    Reviewed: Preterm labor symptoms and general obstetric precautions including but not limited to vaginal bleeding, contractions, leaking of fluid and fetal movement were reviewed in detail with the patient.  All questions were answered. Does have home bp cuff. Office bp cuff given: not applicable. Check bp weekly, let us know if consistently >140 and/or >90.  Follow-up: Return in about 2 weeks (around 01/10/2023) for [place], CNM, in person or online.  Future Appointments  Date Time Provider [place]  01/10/2023  8:50 AM [place], Joaquim Lai, CNM [facility]    No orders of the defined types were placed in this encounter.  [place], [facility] 12/27/2022 9:28 AM

## 2023-01-10 ENCOUNTER — Encounter: Payer: Medicaid Other | Admitting: Advanced Practice Midwife

## 2023-01-11 ENCOUNTER — Ambulatory Visit (INDEPENDENT_AMBULATORY_CARE_PROVIDER_SITE_OTHER): Payer: Medicaid Other | Admitting: Women's Health

## 2023-01-11 ENCOUNTER — Encounter: Payer: Self-pay | Admitting: Women's Health

## 2023-01-11 VITALS — BP 127/81 | HR 90 | Wt 262.2 lb

## 2023-01-11 DIAGNOSIS — Z3403 Encounter for supervision of normal first pregnancy, third trimester: Secondary | ICD-10-CM

## 2023-01-11 DIAGNOSIS — Z3A32 32 weeks gestation of pregnancy: Secondary | ICD-10-CM

## 2023-01-11 DIAGNOSIS — Z3402 Encounter for supervision of normal first pregnancy, second trimester: Secondary | ICD-10-CM

## 2023-01-11 NOTE — Progress Notes (Signed)
LOW-RISK PREGNANCY VISIT Patient name: Alejandra Diaz MRN [id number]  Date of birth: 1996-01-21 Chief Complaint:   Routine Prenatal Visit  History of Present Illness:   Alejandra Diaz is a 27 y.o. G37P0000 female at [redacted]w[redacted]d with an Estimated Date of Delivery: 03/04/23 being seen today for ongoing management of a low-risk pregnancy.   Today she reports  tired, wants to come out of work. 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 Discussed wouldn't be medically necessary, so couldn't give her a note saying she needs to come out. . Contractions: Not present.  .  Movement: Present. denies leaking of fluid.     08/20/2022   10:24 AM  Depression screen PHQ 2/9  Decreased Interest 2  Down, Depressed, Hopeless 1  PHQ - 2 Score 3  Altered sleeping 0  Tired, decreased energy 3  Change in appetite 1  Feeling bad or failure about yourself  0  Trouble concentrating 0  Moving slowly or fidgety/restless 0  Suicidal thoughts 0  PHQ-9 Score 7        08/20/2022   10:25 AM  GAD 7 : Generalized Anxiety Score  Nervous, Anxious, on Edge 1  Control/stop worrying 0  Worry too much - different things 0  Trouble relaxing 0  Restless 0  Easily annoyed or irritable 3  Afraid - awful might happen 1  Total GAD 7 Score 5      Review of Systems:   Pertinent items are noted in HPI Denies abnormal vaginal discharge w/ itching/odor/irritation, headaches, visual changes, shortness of breath, chest pain, abdominal pain, severe nausea/vomiting, or problems with urination or bowel movements unless otherwise stated above. Pertinent History Reviewed:  Reviewed past medical,surgical, social, obstetrical and family history.  Reviewed problem list, medications and allergies. Physical Assessment:   Vitals:   01/11/23 1154  BP: 127/81  Pulse: 90  Weight: 262 lb 3.2 oz (118.9 kg)  Body mass index is 41.07 kg/m.        Physical Examination:   General appearance: Well appearing, and in no distress  Mental status: Alert, oriented to  person, place, and time  Skin: Warm & dry  Cardiovascular: Normal heart rate noted  Respiratory: Normal respiratory effort, no distress  Abdomen: Soft, gravid, nontender  Pelvic: Cervical exam deferred         Extremities: Edema: None  Fetal Status: Fetal Heart Rate (bpm): 152 Fundal Height: 32 cm Movement: Present    Chaperone: N/A   No results found for this or any previous visit (from the past 24 hour(s)).  Assessment & Plan:  1) Low-risk pregnancy G1P0000 at [redacted]w[redacted]d with an Estimated Date of Delivery: 03/04/23   2) +RPR earlier this pregnancy, s/p Bicillin x 3, partner had bicillin x 2 then doxy x 14d   Meds: No orders of the defined types were placed in this encounter.  Labs/procedures today: none  Plan:  Continue routine obstetrical care  Next visit: prefers online    Reviewed: Preterm labor symptoms and general obstetric precautions including but not limited to vaginal bleeding, contractions, leaking of fluid and fetal movement were reviewed in detail with the patient.  All questions were answered. Does have home bp cuff. Office bp cuff given: not applicable. Check bp weekly, let us know if consistently >140 and/or >90.  Follow-up: Return in about 2 weeks (around 01/25/2023) for LROB, CNM, MyChart Video.  No future appointments.  No orders of the defined types were placed in this encounter.  [place], [facility] 01/11/2023 12:31  PM  

## 2023-01-11 NOTE — Patient Instructions (Signed)
Alejandra Diaz, thank you for choosing our office today! We appreciate the opportunity to meet your healthcare needs. You may receive a short survey by mail, e-mail, or through [employer]. If you are happy with your care we would appreciate if you could take just a few minutes to complete the survey questions. We read all of your comments and take your feedback very seriously. Thank you again for choosing our office.  Center for [employer] Team at [place] at [facility] ([place], [place] [address]) Entrance C, located off of [address] parking   CLASSES: Go to [email or web address] to register for classes (childbirth, breastfeeding, waterbirth, infant CPR, daddy bootcamp, etc.)  Call the office [phone]) or go to [facility] if: You begin to have strong, frequent contractions Your water breaks.  Sometimes it is a big gush of fluid, sometimes it is just a trickle that keeps getting your panties wet or running down your legs You have vaginal bleeding.  It is normal to have a small amount of spotting if your cervix was checked.  You don't feel your baby moving like normal.  If you don't, get you something to eat and drink and lay down and focus on feeling your baby move.   If your baby is still not moving like normal, you should call the office or go to [facility].  Call the office [phone]) or go to [facility] hospital for these signs of pre-eclampsia: Severe headache that does not go away with Tylenol Visual changes- seeing spots, double, blurred vision Pain under your right breast or upper abdomen that does not go away with Tums or heartburn medicine Nausea and/or vomiting Severe swelling in your hands, feet, and face   Tdap Vaccine It is recommended that you get the Tdap vaccine during the third trimester of EACH pregnancy to help protect your baby from getting pertussis (whooping cough) 27-36 weeks is the BEST time to do  this so that you can pass the protection on to your baby. During pregnancy is better than after pregnancy, but if you are unable to get it during pregnancy it will be offered at the hospital.  You can get this vaccine with [place], at the health department, your family doctor, or some local pharmacies Everyone who will be around your baby should also be up-to-date on their vaccines before the baby comes. Adults (who are not pregnant) only need 1 dose of Tdap during adulthood.   [facility] Pediatricians/Family Doctors [facility]): [address] [place], [facility]: [address] [place], [phone]                [facility]): [place], [phone] (call to ask if accepting patients) [facility] Department: [address], [place], [place] Pediatricians/Family Doctors [facility]): [place]. [place], [address], [place] [facility]: [address] [place], [facility]: [place], [place] [facility]): [phone] [facility]: [address] [place]: [address] [facility]: [phone], [phone]  Home Blood Pressure Monitoring for Patients   Your provider has recommended that you check your  blood pressure (BP) at least once a week at home. If you do not have a blood pressure cuff at home, one will be provided for you. Contact your provider if you have not received your monitor within 1 week.   Helpful Tips for Accurate Home Blood Pressure Checks  Don't smoke, exercise, or drink caffeine 30 minutes before checking your BP Use the restroom before checking your BP (a full bladder can raise your  pressure) Relax in a comfortable upright chair Feet on the ground Left arm resting comfortably on a flat surface at the level of your heart Legs uncrossed Back supported Sit quietly and don't talk Place the cuff on your bare arm Adjust snuggly, so that only two fingertips can fit between your skin and the top of the cuff Check 2 readings separated by at least one minute Keep a log of your BP readings For a visual, please reference this diagram: http://ccnc.care/bpdiagram  Provider Name: [facility]     Phone: [phone]  Zone 1: ALL CLEAR  Continue to monitor your symptoms:  BP reading is less than 140 (top number) or less than 90 (bottom number)  No right upper stomach pain No headaches or seeing spots No feeling nauseated or throwing up No swelling in face and hands  Zone 2: CAUTION Call your doctor's office for any of the following:  BP reading is greater than 140 (top number) or greater than 90 (bottom number)  Stomach pain under your ribs in the middle or right side Headaches or seeing spots Feeling nauseated or throwing up Swelling in face and hands  Zone 3: EMERGENCY  Seek immediate medical care if you have any of the following:  BP reading is greater than160 (top number) or greater than 110 (bottom number) Severe headaches not improving with Tylenol Serious difficulty catching your breath Any worsening symptoms from Zone 2  Preterm Labor and Birth Information  The normal length of a pregnancy is 39-41 weeks. Preterm labor is when labor starts before 37 completed weeks of pregnancy. What are the risk factors for preterm labor? Preterm labor is more likely to occur in women who: Have certain infections during pregnancy such as a bladder infection, sexually transmitted infection, or infection inside the uterus (chorioamnionitis). Have a shorter-than-normal cervix. Have gone into preterm labor before. Have had surgery on their cervix. Are younger than age 17  or older than age 35. Are African American. Are pregnant with twins or multiple babies (multiple gestation). Take street drugs or smoke while pregnant. Do not gain enough weight while pregnant. Became pregnant shortly after having been pregnant. What are the symptoms of preterm labor? Symptoms of preterm labor include: Cramps similar to those that can happen during a menstrual period. The cramps may happen with diarrhea. Pain in the abdomen or lower back. Regular uterine contractions that may feel like tightening of the abdomen. A feeling of increased pressure in the pelvis. Increased watery or bloody mucus discharge from the vagina. Water breaking (ruptured amniotic sac). Why is it important to recognize signs of preterm labor? It is important to recognize signs of preterm labor because babies who are born prematurely may not be fully developed. This can put them at an increased risk for: Long-term (chronic) heart and lung problems. Difficulty immediately after birth with regulating body systems, including blood sugar, body temperature, heart rate, and breathing rate. Bleeding in the brain. Cerebral palsy. Learning difficulties. Death. These risks are highest for babies who are born before 34 weeks   of pregnancy. How is preterm labor treated? Treatment depends on the length of your pregnancy, your condition, and the health of your baby. It may involve: Having a stitch (suture) placed in your cervix to prevent your cervix from opening too early (cerclage). Taking or being given medicines, such as: Hormone medicines. These may be given early in pregnancy to help support the pregnancy. Medicine to stop contractions. Medicines to help mature the baby's lungs. These may be prescribed if the risk of delivery is high. Medicines to prevent your baby from developing cerebral palsy. If the labor happens before 34 weeks of pregnancy, you may need to stay in the hospital. What should I do if I  think I am in preterm labor? If you think that you are going into preterm labor, call your health care provider right away. How can I prevent preterm labor in future pregnancies? To increase your chance of having a full-term pregnancy: Do not use any tobacco products, such as cigarettes, chewing tobacco, and e-cigarettes. If you need help quitting, ask your health care provider. Do not use street drugs or medicines that have not been prescribed to you during your pregnancy. Talk with your health care provider before taking any herbal supplements, even if you have been taking them regularly. Make sure you gain a healthy amount of weight during your pregnancy. Watch for infection. If you think that you might have an infection, get it checked right away. Make sure to tell your health care provider if you have gone into preterm labor before. This information is not intended to replace advice given to you by your health care provider. Make sure you discuss any questions you have with your health care provider. Document Revised: 03/30/2019 Document Reviewed: 04/28/2016 Elsevier Patient Education  2020 Elsevier Inc.   

## 2023-01-24 ENCOUNTER — Telehealth: Payer: Medicaid Other | Admitting: Advanced Practice Midwife

## 2023-01-25 ENCOUNTER — Telehealth (INDEPENDENT_AMBULATORY_CARE_PROVIDER_SITE_OTHER): Payer: Medicaid Other | Admitting: Obstetrics and Gynecology

## 2023-01-25 ENCOUNTER — Encounter: Payer: Self-pay | Admitting: Obstetrics and Gynecology

## 2023-01-25 VITALS — BP 129/68

## 2023-01-25 DIAGNOSIS — Z3A34 34 weeks gestation of pregnancy: Secondary | ICD-10-CM

## 2023-01-25 DIAGNOSIS — Z3403 Encounter for supervision of normal first pregnancy, third trimester: Secondary | ICD-10-CM

## 2023-01-25 NOTE — Progress Notes (Signed)
   OBSTETRICS PRENATAL VIRTUAL VISIT ENCOUNTER NOTE  Provider location: Center for [place] at [facility]   Patient location: Home  I connected with Alejandra Diaz on 01/25/23 at 11:50 AM EST by MyChart Video Encounter and verified that I am speaking with the correct person using two identifiers. I discussed the limitations, risks, security and privacy concerns of performing an evaluation and management service virtually and the availability of in person appointments. I also discussed with the patient that there may be a patient responsible charge related to this service. The patient expressed understanding and agreed to proceed. Subjective:  Alejandra Diaz is a 27 y.o. G1P0000 at [redacted]w[redacted]d being seen today for ongoing prenatal care.  She is currently monitored for the following issues for this low-risk pregnancy and has History of chlamydia; Supervision of normal first pregnancy; and Primary syphilis on their problem list.  Patient reports no complaints.  Contractions: Irregular. Vag. Bleeding: None.  Movement: Present. Denies any leaking of fluid.   The following portions of the patient's history were reviewed and updated as appropriate: allergies, current medications, past family history, past medical history, past social history, past surgical history and problem list.   Objective:   Vitals:   01/25/23 1158  BP: 129/68    Fetal Status:     Movement: Present     General:  Alert, oriented and cooperative. Patient is in no acute distress.  Respiratory: Normal respiratory effort, no problems with respiration noted  Mental Status: Normal mood and affect. Normal behavior. Normal judgment and thought content.  Rest of physical exam deferred due to type of encounter  Imaging: No results found.  Assessment and Plan:  Pregnancy: G1P0000 at [redacted]w[redacted]d 1. Encounter for supervision of normal first pregnancy in third trimester Stable Vaginal cultures at next visit  Preterm labor  symptoms and general obstetric precautions including but not limited to vaginal bleeding, contractions, leaking of fluid and fetal movement were reviewed in detail with the patient. I discussed the assessment and treatment plan with the patient. The patient was provided an opportunity to ask questions and all were answered. The patient agreed with the plan and demonstrated an understanding of the instructions. The patient was advised to call back or seek an in-person office evaluation/go to [facility] at [facility] for any urgent or concerning symptoms. Please refer to After Visit Summary for other counseling recommendations.   I provided 8 minutes of face-to-face time during this encounter.  Return in about 2 weeks (around 02/08/2023) for OB visit, face to face, any provider.  No future appointments.  Chancy Milroy, MD Center for [place], [place]

## 2023-01-26 ENCOUNTER — Encounter: Payer: Self-pay | Admitting: Advanced Practice Midwife

## 2023-02-10 ENCOUNTER — Other Ambulatory Visit (HOSPITAL_COMMUNITY)
Admission: RE | Admit: 2023-02-10 | Discharge: 2023-02-10 | Disposition: A | Discharge status: Home / Self Care | Payer: Medicaid Other | Source: Ambulatory Visit | Attending: Advanced Practice Midwife | Admitting: Advanced Practice Midwife

## 2023-02-10 ENCOUNTER — Ambulatory Visit (INDEPENDENT_AMBULATORY_CARE_PROVIDER_SITE_OTHER): Payer: Medicaid Other | Admitting: Advanced Practice Midwife

## 2023-02-10 VITALS — BP 122/76 | HR 78 | Wt 273.0 lb

## 2023-02-10 DIAGNOSIS — E669 Obesity, unspecified: Secondary | ICD-10-CM | POA: Insufficient documentation

## 2023-02-10 DIAGNOSIS — Z3403 Encounter for supervision of normal first pregnancy, third trimester: Secondary | ICD-10-CM | POA: Diagnosis not present

## 2023-02-10 DIAGNOSIS — Z3A36 36 weeks gestation of pregnancy: Secondary | ICD-10-CM

## 2023-02-10 NOTE — Progress Notes (Signed)
   LOW-RISK PREGNANCY VISIT Patient name: Alejandra Diaz MRN [id number]  Date of birth: 1996/07/18 Chief Complaint:   Routine Prenatal Visit  History of Present Illness:   Alejandra Diaz is a 27 y.o. G74P0000 female at 53w6dwith an Estimated Date of Delivery: 03/04/23 being seen today for ongoing management of a low-risk pregnancy.  Today she reports no complaints. Contractions: Not present. Vag. Bleeding: None.  Movement: Present. denies leaking of fluid. Review of Systems:   Pertinent items are noted in HPI Denies abnormal vaginal discharge w/ itching/odor/irritation, headaches, visual changes, shortness of breath, chest pain, abdominal pain, severe nausea/vomiting, or problems with urination or bowel movements unless otherwise stated above. Pertinent History Reviewed:  Reviewed past medical,surgical, social, obstetrical and family history.  Reviewed problem list, medications and allergies. Physical Assessment:   Vitals:   02/10/23 0925  BP: 122/76  Pulse: 78  Weight: 273 lb (123.8 kg)  Body mass index is 42.76 kg/m.        Physical Examination:   General appearance: Well appearing, and in no distress  Mental status: Alert, oriented to person, place, and time  Skin: Warm & dry  Cardiovascular: Normal heart rate noted  Respiratory: Normal respiratory effort, no distress  Abdomen: Soft, gravid, nontender  Pelvic: Cervical exam performed  Dilation: 1 Effacement (%): Thick Station: -3  Extremities:    Fetal Status: Fetal Heart Rate (bpm): 142 Fundal Height: 40 cm Movement: Present Presentation: Vertex  Chaperone:  ACelene Squibb   No results found for this or any previous visit (from the past 24 hour(s)).  Assessment & Plan:    Pregnancy: G1P0000 at 360w6d. [redacted] weeks gestation of pregnancy  - Culture, beta strep (group b only) - Cervicovaginal ancillary only( [place] 2. Encounter for supervision of normal first pregnancy in third trimester  - Culture, beta strep  (group b only) - Cervicovaginal ancillary only( [place])     Meds: No orders of the defined types were placed in this encounter.  Labs/procedures today: GBS/GC/CHL  Plan:  Continue routine obstetrical care  Next visit: prefers in person    Reviewed: Term labor symptoms and general obstetric precautions including but not limited to vaginal bleeding, contractions, leaking of fluid and fetal movement were reviewed in detail with the patient.  All questions were answered. Has home bp cuff.. Check bp weekly, let usKoreanow if >140/90.   Follow-up: Return for weekly LROB X4; needs EFW ASAP.  Future Appointments  Date Time Provider [place]2/27/2024 10:00 AM [facility] [place]-FTIMG None  02/15/2023 10:50 AM BoRoma SchanzCNM [facility]  02/22/2023 10:30 AM ErChancy MilroyMD [facility]/11/2023 10:30 AM BoRoma SchanzCNM [facility]    Orders Placed This Encounter  Procedures   Culture, beta strep (group b only)   [place] Follow Up   FrChristin FudgeNP, CNM 02/10/2023 3:45 PM

## 2023-02-10 NOTE — Patient Instructions (Signed)

## 2023-02-11 LAB — CERVICOVAGINAL ANCILLARY ONLY
Chlamydia: NEGATIVE
Comment: NEGATIVE
Comment: NORMAL
Neisseria Gonorrhea: NEGATIVE

## 2023-02-13 LAB — CULTURE, BETA STREP (GROUP B ONLY): Strep Gp B Culture: POSITIVE — AB

## 2023-02-15 ENCOUNTER — Ambulatory Visit (INDEPENDENT_AMBULATORY_CARE_PROVIDER_SITE_OTHER): Payer: Medicaid Other

## 2023-02-15 ENCOUNTER — Encounter: Payer: Self-pay | Admitting: Women's Health

## 2023-02-15 ENCOUNTER — Ambulatory Visit (INDEPENDENT_AMBULATORY_CARE_PROVIDER_SITE_OTHER): Payer: Medicaid Other | Admitting: Women's Health

## 2023-02-15 VITALS — BP 115/69 | HR 101 | Wt 273.8 lb

## 2023-02-15 DIAGNOSIS — Z3A37 37 weeks gestation of pregnancy: Secondary | ICD-10-CM | POA: Diagnosis not present

## 2023-02-15 DIAGNOSIS — Z3403 Encounter for supervision of normal first pregnancy, third trimester: Secondary | ICD-10-CM

## 2023-02-15 DIAGNOSIS — E669 Obesity, unspecified: Secondary | ICD-10-CM | POA: Diagnosis not present

## 2023-02-15 NOTE — Patient Instructions (Signed)
[place], thank you for choosing our office today! We appreciate the opportunity to meet your healthcare needs. You may receive a short survey by mail, e-mail, or through [employer]. If you are happy with your care we would appreciate if you could take just a few minutes to complete the survey questions. We read all of your comments and take your feedback very seriously. Thank you again for choosing our office.  Center for [employer] Team at [place] at [facility] ([place], [place] [address]) Entrance C, located off of [place] parking   CLASSES: Go to [email or web address] to register for classes (childbirth, breastfeeding, waterbirth, infant CPR, daddy bootcamp, etc.)  Call the office [phone]) or go to [facility] if: You begin to have strong, frequent contractions Your water breaks.  Sometimes it is a big gush of fluid, sometimes it is just a trickle that keeps getting your panties wet or running down your legs You have vaginal bleeding.  It is normal to have a small amount of spotting if your cervix was checked.  You don't feel your baby moving like normal.  If you don't, get you something to eat and drink and lay down and focus on feeling your baby move.   If your baby is still not moving like normal, you should call the office or go to [facility].  Call the office [phone]) or go to [facility] hospital for these signs of pre-eclampsia: Severe headache that does not go away with Tylenol Visual changes- seeing spots, double, blurred vision Pain under your right breast or upper abdomen that does not go away with Tums or heartburn medicine Nausea and/or vomiting Severe swelling in your hands, feet, and face   [facility] Pediatricians/Family Doctors [facility]): [address] [place], [place]: [address] [place], [facility]): [place], [phone] (call to ask if accepting patients) [facility] Department: [address], [place], [facility]): [address], [place] [facility]: [address] [place], [place] [facility]: [place], [place] [facility]): [phone] [facility]: [address] [place]: [address] [place] [facility]: [phone], [phone]  Home Blood Pressure Monitoring for Patients   Your provider has recommended that you check your blood pressure (BP) at least once a week at home. If you do not have a blood pressure cuff at home, one will be provided for you. Contact your provider if you have not received your monitor within 1 week.   Helpful Tips for Accurate Home Blood Pressure Checks  Don't smoke, exercise, or drink caffeine 30 minutes before checking your BP Use the restroom before checking your BP (a full bladder can raise your pressure) Relax in a comfortable upright chair Feet on the ground Left arm resting comfortably on a flat surface at the level of your heart Legs uncrossed Back supported Sit quietly and don't talk Place the cuff on your bare arm Adjust snuggly, so that only two fingertips  can fit between your skin and the top of the cuff Check 2 readings separated by at least one minute Keep a log of your BP readings For a visual, please reference this diagram: http://ccnc.care/bpdiagram  Provider Name: [facility]     Phone: [phone]  Zone 1: ALL CLEAR  Continue to monitor your symptoms:  BP reading is less than 140 (top number) or less than 90 (bottom number)  No right  upper stomach pain No headaches or seeing spots No feeling nauseated or throwing up No swelling in face and hands  Zone 2: CAUTION Call your doctor's office for any of the following:  BP reading is greater than 140 (top number) or greater than 90 (bottom number)  Stomach pain under your ribs in the middle or right side Headaches or seeing spots Feeling nauseated or throwing up Swelling in face and hands  Zone 3: EMERGENCY  Seek immediate medical care if you have any of the following:  BP reading is greater than160 (top number) or greater than 110 (bottom number) Severe headaches not improving with Tylenol Serious difficulty catching your breath Any worsening symptoms from Zone 2   Braxton Hicks Contractions Contractions of the uterus can occur throughout pregnancy, but they are not always a sign that you are in labor. You may have practice contractions called Braxton Hicks contractions. These false labor contractions are sometimes confused with true labor. What are [place] contractions? Braxton Hicks contractions are tightening movements that occur in the muscles of the uterus before labor. Unlike true labor contractions, these contractions do not result in opening (dilation) and thinning of the cervix. Toward the end of pregnancy (32-34 weeks), Braxton Hicks contractions can happen more often and may become stronger. These contractions are sometimes difficult to tell apart from true labor because they can be very uncomfortable. You should not feel embarrassed if you go to the hospital with false labor. Sometimes, the only way to tell if you are in true labor is for your health care provider to look for changes in the cervix. The health care provider will do a physical exam and may monitor your contractions. If you are not in true labor, the exam should show that your cervix is not dilating and your water has not broken. If there are no other health problems associated with your  pregnancy, it is completely safe for you to be sent home with false labor. You may continue to have Braxton Hicks contractions until you go into true labor. How to tell the difference between true labor and false labor True labor Contractions last 30-70 seconds. Contractions become very regular. Discomfort is usually felt in the top of the uterus, and it spreads to the lower abdomen and low back. Contractions do not go away with walking. Contractions usually become more intense and increase in frequency. The cervix dilates and gets thinner. False labor Contractions are usually shorter and not as strong as true labor contractions. Contractions are usually irregular. Contractions are often felt in the front of the lower abdomen and in the groin. Contractions may go away when you walk around or change positions while lying down. Contractions get weaker and are shorter-lasting as time goes on. The cervix usually does not dilate or become thin. Follow these instructions at home:  Take over-the-counter and prescription medicines only as told by your health care provider. Keep up with your usual exercises and follow other instructions from your health care provider. Eat and drink lightly if you think  you are going into labor. If Braxton Hicks contractions are making you uncomfortable: Change your position from lying down or resting to walking, or change from walking to resting. Sit and rest in a tub of warm water. Drink enough fluid to keep your urine pale yellow. Dehydration may cause these contractions. Do slow and deep breathing several times an hour. Keep all follow-up prenatal visits as told by your health care provider. This is important. Contact a health care provider if: You have a fever. You have continuous pain in your abdomen. Get help right away if: Your contractions become stronger, more regular, and closer together. You have fluid leaking or gushing from your vagina. You pass  blood-tinged mucus (bloody show). You have bleeding from your vagina. You have low back pain that you never had before. You feel your baby's head pushing down and causing pelvic pressure. Your baby is not moving inside you as much as it used to. Summary Contractions that occur before labor are called Braxton Hicks contractions, false labor, or practice contractions. Braxton Hicks contractions are usually shorter, weaker, farther apart, and less regular than true labor contractions. True labor contractions usually become progressively stronger and regular, and they become more frequent. Manage discomfort from [facility] contractions by changing position, resting in a warm bath, drinking plenty of water, or practicing deep breathing. This information is not intended to replace advice given to you by your health care provider. Make sure you discuss any questions you have with your health care provider. Document Revised: 11/18/2017 Document Reviewed: 04/21/2017 Elsevier Patient Education  [place].

## 2023-02-15 NOTE — Progress Notes (Signed)
LOW-RISK PREGNANCY VISIT Patient name: Alejandra Diaz MRN [id number]  Date of birth: Aug 04, 1996 Chief Complaint:   Routine Prenatal Visit and Pregnancy Ultrasound  History of Present Illness:   Alejandra Diaz is a 27 y.o. G74P0000 female at 36w4dwith an Estimated Date of Delivery: 03/04/23 being seen today for ongoing management of a low-risk pregnancy.   Today she reports no complaints. Contractions: Irregular.  .  Movement: Present. denies leaking of fluid.     02/10/2023    9:27 AM 08/20/2022   10:24 AM  Depression screen PHQ 2/9  Decreased Interest 3 2  Down, Depressed, Hopeless 1 1  PHQ - 2 Score 4 3  Altered sleeping 3 0  Tired, decreased energy 3 3  Change in appetite 1 1  Feeling bad or failure about yourself  0 0  Trouble concentrating 0 0  Moving slowly or fidgety/restless 1 0  Suicidal thoughts 0 0  PHQ-9 Score 12 7        02/10/2023    9:28 AM 08/20/2022   10:25 AM  GAD 7 : Generalized Anxiety Score  Nervous, Anxious, on Edge 0 1  Control/stop worrying 0 0  Worry too much - different things 0 0  Trouble relaxing 0 0  Restless 1 0  Easily annoyed or irritable 3 3  Afraid - awful might happen 2 1  Total GAD 7 Score 6 5      Review of Systems:   Pertinent items are noted in HPI Denies abnormal vaginal discharge w/ itching/odor/irritation, headaches, visual changes, shortness of breath, chest pain, abdominal pain, severe nausea/vomiting, or problems with urination or bowel movements unless otherwise stated above. Pertinent History Reviewed:  Reviewed past medical,surgical, social, obstetrical and family history.  Reviewed problem list, medications and allergies. Physical Assessment:   Vitals:   02/15/23 1052  BP: 115/69  Pulse: (!) 101  Weight: 273 lb 12.8 oz (124.2 kg)  Body mass index is 42.88 kg/m.        Physical Examination:   General appearance: Well appearing, and in no distress  Mental status: Alert, oriented to person, place, and time  Skin:  Warm & dry  Cardiovascular: Normal heart rate noted  Respiratory: Normal respiratory effort, no distress  Abdomen: Soft, gravid, nontender  Pelvic: Cervical exam deferred         Extremities: Edema: None  Fetal Status:     Movement: Present  [id number],cephalic,FHR 1123456bpm,posterior placenta gr 3,AFI 14 cm,EFW 3342 g 68% (done for BMI 42)  Chaperone: N/A   No results found for this or any previous visit (from the past 24 hour(s)).  Assessment & Plan:  1) Low-risk pregnancy G1P0000 at 324w4dith an Estimated Date of Delivery: 03/04/23   2) +RPR earlier this pregnancy, s/p Bicillin x 3, partner had bicillin x 2 then doxy x 14d    Meds: No orders of the defined types were placed in this encounter.  Labs/procedures today: U/S  Plan:  Continue routine obstetrical care  Next visit: prefers online    Reviewed: Term labor symptoms and general obstetric precautions including but not limited to vaginal bleeding, contractions, leaking of fluid and fetal movement were reviewed in detail with the patient.  All questions were answered. Does have home bp cuff. Office bp cuff given: not applicable. Check bp weekly, let us[place]oreanow if consistently >140 and/or >90.  Follow-up: Return for As scheduled-wants online visit next week.  Future Appointments  Date Time Provider [place]  02/22/2023 10:30 AM Chancy Milroy, MD [facility]  03/01/2023 10:30 AM Roma Schanz, CNM [facility]    No orders of the defined types were placed in this encounter.  [place], [facility] 02/15/2023 11:21 AM

## 2023-02-15 NOTE — Progress Notes (Signed)
[place] [id number] wks,cephalic,FHR 123456 bpm,posterior placenta gr 3,AFI 14 cm,EFW 3342 g 68%

## 2023-02-22 ENCOUNTER — Encounter: Payer: Self-pay | Admitting: Obstetrics and Gynecology

## 2023-02-22 ENCOUNTER — Telehealth: Payer: Medicaid Other | Admitting: Obstetrics and Gynecology

## 2023-02-23 NOTE — Progress Notes (Signed)
Pt rescheduled virtual visit

## 2023-02-24 ENCOUNTER — Encounter: Payer: Medicaid Other | Admitting: Obstetrics and Gynecology

## 2023-03-01 ENCOUNTER — Encounter (HOSPITAL_COMMUNITY): Payer: Self-pay

## 2023-03-01 ENCOUNTER — Ambulatory Visit (INDEPENDENT_AMBULATORY_CARE_PROVIDER_SITE_OTHER): Payer: Medicaid Other | Admitting: Women's Health

## 2023-03-01 ENCOUNTER — Telehealth (HOSPITAL_COMMUNITY): Payer: Self-pay | Admitting: *Deleted

## 2023-03-01 ENCOUNTER — Encounter: Payer: Self-pay | Admitting: Women's Health

## 2023-03-01 VITALS — BP 114/77 | HR 92 | Wt 272.4 lb

## 2023-03-01 DIAGNOSIS — Z3403 Encounter for supervision of normal first pregnancy, third trimester: Secondary | ICD-10-CM

## 2023-03-01 DIAGNOSIS — E669 Obesity, unspecified: Secondary | ICD-10-CM

## 2023-03-01 DIAGNOSIS — Z3A39 39 weeks gestation of pregnancy: Secondary | ICD-10-CM

## 2023-03-01 NOTE — Telephone Encounter (Signed)
Preadmission screen  

## 2023-03-01 NOTE — Patient Instructions (Signed)
[place], thank you for choosing our office today! We appreciate the opportunity to meet your healthcare needs. You may receive a short survey by mail, e-mail, or through MyChart. If you are happy with your care we would appreciate if you could take just a few minutes to complete the survey questions. We read all of your comments and take your feedback very seriously. Thank you again for choosing our office.  [facility] at [facility]  [facility] [place] ([address] [place], [place] [address]) Entrance C, located off of [address] Free 24/7 valet parking   CLASSES: Go to [email or web address] to register for classes (childbirth, breastfeeding, waterbirth, infant CPR, daddy bootcamp, etc.)  Call the office ([phone]) or go to [facility] if: You begin to have strong, frequent contractions Your water breaks.  Sometimes it is a big gush of fluid, sometimes it is just a trickle that keeps getting your panties wet or running down your legs You have vaginal bleeding.  It is normal to have a small amount of spotting if your cervix was checked.  You don't feel your baby moving like normal.  If you don't, get you something to eat and drink and lay down and focus on feeling your baby move.   If your baby is still not moving like normal, you should call the office or go to [facility].  Call the office ([phone]) or [facility] to [facility] for these signs of pre-eclampsia: Severe headache that does not go away with Tylenol Visual changes- seeing spots, double, blurred vision Pain under your right breast or upper abdomen that does not go away with Tums or heartburn medicine Nausea and/or vomiting Severe swelling in your hands, feet, and face   [place] Pediatricians/Family Doctors [facility] ([facility]): [address], [phone]           [facility]: [address], [phone]                 [facility] ([facility]): [address], [phone] (call to ask if accepting patients) [facility]: [address], [place], [phone]    [place] Pediatricians/Family Doctors [facility] ([facility]): [address], [phone] [facility]: [address], [phone] [facility]: [address], [phone]  [place] [facility]  [facility] ([facility]): [phone] [facility]: [address], [phone]   [place] Family Doctors [facility]: [address], [phone]  [place] [facility]  [facility]: [address], [phone]  Home Blood Pressure Monitoring for Patients   Your provider has recommended that you check your blood pressure (BP) at least once a week at home. If you do not have a blood pressure cuff at home, one will be provided for you. Contact your provider if you have not received your monitor within 1 week.   Helpful Tips for Accurate Home Blood Pressure Checks  Don't smoke, exercise, or drink caffeine 30 minutes before checking your BP Use the restroom before checking your BP (a full bladder can raise your pressure) Relax in a comfortable upright chair Feet on the ground Left arm resting comfortably on a flat surface at the level of your heart Legs uncrossed Back supported Sit quietly and don't talk Place the cuff on your bare arm Adjust snuggly, so that only two fingertips   can fit between your skin and the top of the cuff Check 2 readings separated by at least one minute Keep a log of your BP readings For a visual, please reference this diagram: http://ccnc.care/bpdiagram  Provider Name: [facility]     Phone: [phone]  Zone 1: ALL CLEAR  Continue to monitor your symptoms:  BP reading is less than 140 (top number) or less than 90 (bottom number)  No right  upper stomach pain No headaches or seeing spots No feeling nauseated or throwing up No swelling in face and hands  Zone 2: CAUTION Call your doctor's office for any of the following:  BP reading is greater than 140 (top number) or greater than 90 (bottom number)  Stomach pain under your ribs in the middle or right side Headaches or seeing spots Feeling nauseated or throwing up Swelling in face and hands  Zone 3: EMERGENCY  Seek immediate medical care if you have any of the following:  BP reading is greater than160 (top number) or greater than 110 (bottom number) Severe headaches not improving with Tylenol Serious difficulty catching your breath Any worsening symptoms from Zone 2   Braxton Hicks Contractions Contractions of the uterus can occur throughout pregnancy, but they are not always a sign that you are in labor. You may have practice contractions called Braxton Hicks contractions. These false labor contractions are sometimes confused with true labor. What are Braxton Hicks contractions? Braxton Hicks contractions are tightening movements that occur in the muscles of the uterus before labor. Unlike true labor contractions, these contractions do not result in opening (dilation) and thinning of the cervix. Toward the end of pregnancy (32-34 weeks), Braxton Hicks contractions can happen more often and may become stronger. These contractions are sometimes difficult to tell apart from true labor because they can be very uncomfortable. You should not feel embarrassed if you go to the hospital with false labor. Sometimes, the only way to tell if you are in true labor is for your health care provider to look for changes in the cervix. The health care provider will do a physical exam and may monitor your contractions. If you are not in true labor, the exam should show that your cervix is not dilating and your water has not broken. If there are no other health problems associated with your  pregnancy, it is completely safe for you to be sent home with false labor. You may continue to have Braxton Hicks contractions until you go into true labor. How to tell the difference between true labor and false labor True labor Contractions last 30-70 seconds. Contractions become very regular. Discomfort is usually felt in the top of the uterus, and it spreads to the lower abdomen and low back. Contractions do not go away with walking. Contractions usually become more intense and increase in frequency. The cervix dilates and gets thinner. False labor Contractions are usually shorter and not as strong as true labor contractions. Contractions are usually irregular. Contractions are often felt in the front of the lower abdomen and in the groin. Contractions may go away when you walk around or change positions while lying down. Contractions get weaker and are shorter-lasting as time goes on. The cervix usually does not dilate or become thin. Follow these instructions at home:  Take over-the-counter and prescription medicines only as told by your health care provider. Keep up with your usual exercises and follow other instructions from your health care provider. Eat and drink lightly if you think   you are going into labor. If Braxton Hicks contractions are making you uncomfortable: Change your position from lying down or resting to walking, or change from walking to resting. Sit and rest in a tub of warm water. Drink enough fluid to keep your urine pale yellow. Dehydration may cause these contractions. Do slow and deep breathing several times an hour. Keep all follow-up prenatal visits as told by your health care provider. This is important. Contact a health care provider if: You have a fever. You have continuous pain in your abdomen. Get help right away if: Your contractions become stronger, more regular, and closer together. You have fluid leaking or gushing from your vagina. You pass  blood-tinged mucus (bloody show). You have bleeding from your vagina. You have low back pain that you never had before. You feel your baby's head pushing down and causing pelvic pressure. Your baby is not moving inside you as much as it used to. Summary Contractions that occur before labor are called Braxton Hicks contractions, false labor, or practice contractions. Braxton Hicks contractions are usually shorter, weaker, farther apart, and less regular than true labor contractions. True labor contractions usually become progressively stronger and regular, and they become more frequent. Manage discomfort from Braxton Hicks contractions by changing position, resting in a warm bath, drinking plenty of water, or practicing deep breathing. This information is not intended to replace advice given to you by your health care provider. Make sure you discuss any questions you have with your health care provider. Document Revised: 11/18/2017 Document Reviewed: 04/21/2017 Elsevier Patient Education  2020 [employer]   

## 2023-03-01 NOTE — Progress Notes (Signed)
LOW-RISK PREGNANCY VISIT Patient name: Alejandra Diaz MRN [id number]  Date of birth: [birth year] Chief Complaint:   Routine Prenatal Visit  History of Present Illness:   Alejandra Diaz is a 27 y.o. G62P0000 female at 47w4dwith an Estimated Date of Delivery: 03/04/23 being seen today for ongoing management of a low-risk pregnancy.   Today she reports no complaints. Contractions: Irregular.  .  Movement: Present. denies leaking of fluid.     02/10/2023    9:27 AM 08/20/2022   10:24 AM  Depression screen PHQ 2/9  Decreased Interest 3 2  Down, Depressed, Hopeless 1 1  PHQ - 2 Score 4 3  Altered sleeping 3 0  Tired, decreased energy 3 3  Change in appetite 1 1  Feeling bad or failure about yourself  0 0  Trouble concentrating 0 0  Moving slowly or fidgety/restless 1 0  Suicidal thoughts 0 0  PHQ-9 Score 12 7        02/10/2023    9:28 AM 08/20/2022   10:25 AM  GAD 7 : Generalized Anxiety Score  Nervous, Anxious, on Edge 0 1  Control/stop worrying 0 0  Worry too much - different things 0 0  Trouble relaxing 0 0  Restless 1 0  Easily annoyed or irritable 3 3  Afraid - awful might happen 2 1  Total GAD 7 Score 6 5      Review of Systems:   Pertinent items are noted in HPI Denies abnormal vaginal discharge w/ itching/odor/irritation, headaches, visual changes, shortness of breath, chest pain, abdominal pain, severe nausea/vomiting, or problems with urination or bowel movements unless otherwise stated above. Pertinent History Reviewed:  Reviewed past medical,surgical, social, obstetrical and family history.  Reviewed problem list, medications and allergies. Physical Assessment:   Vitals:   03/01/23 1039  BP: 114/77  Pulse: 92  Weight: 272 lb 6.4 oz (123.6 kg)  Body mass index is 42.66 kg/m.        Physical Examination:   General appearance: Well appearing, and in no distress  Mental status: Alert, oriented to person, place, and time  Skin: Warm & dry  Cardiovascular:  Normal heart rate noted  Respiratory: Normal respiratory effort, no distress  Abdomen: Soft, gravid, nontender  Pelvic: Cervical exam deferred, declined by pt        Extremities: Edema: None  Fetal Status: Fetal Heart Rate (bpm): 144 Fundal Height: 38 cm Movement: Present Presentation: Vertex  Chaperone: N/A   No results found for this or any previous visit (from the past 24 hour(s)).  Assessment & Plan:  1) Low-risk pregnancy G1P0000 at 318w4dith an Estimated Date of Delivery: 03/04/23   2) BMI >35, discussed recommendation for IOL by 40wks, next IOL available 3/17 (40.2), scheduled and IOL form faxed, orders entered  3) +RPR earlier this pregnancy, s/p Bicillin x 3, partner had bicillin x 2 then doxy x 14d     Meds: No orders of the defined types were placed in this encounter.  Labs/procedures today: none  Plan:  as above  Reviewed: Term labor symptoms and general obstetric precautions including but not limited to vaginal bleeding, contractions, leaking of fluid and fetal movement were reviewed in detail with the patient.  All questions were answered. Does have home bp cuff. Office bp cuff given: not applicable. Check bp weekly, let usKoreanow if consistently >140 and/or >90.  Follow-up: Return for will schedule pp after delivery.  No future appointments.  No orders of the defined  types were placed in this encounter.  Roma Schanz CNM, [facility] 03/01/2023 11:17 AM

## 2023-03-02 ENCOUNTER — Other Ambulatory Visit: Payer: Self-pay | Admitting: Advanced Practice Midwife

## 2023-03-02 ENCOUNTER — Telehealth (HOSPITAL_COMMUNITY): Payer: Self-pay | Admitting: *Deleted

## 2023-03-02 NOTE — Telephone Encounter (Signed)
Preadmission screen  

## 2023-03-03 ENCOUNTER — Telehealth (HOSPITAL_COMMUNITY): Payer: Self-pay | Admitting: *Deleted

## 2023-03-03 ENCOUNTER — Encounter: Payer: Self-pay | Admitting: Women's Health

## 2023-03-03 NOTE — Telephone Encounter (Signed)
Preadmission screen  

## 2023-03-04 ENCOUNTER — Telehealth (HOSPITAL_COMMUNITY): Payer: Self-pay | Admitting: *Deleted

## 2023-03-04 NOTE — Telephone Encounter (Signed)
Preadmission screen  

## 2023-03-04 NOTE — Telephone Encounter (Signed)
Spoke with Dr Annamaria Boots today; he is in agreement and it is his recommendation that Alejandra Diaz be induced as scheduled on 03/06/23 due to elevated BMI and post term status of 40.2wks.  Myrtis Ser CNM 03/04/2023 12:25 PM

## 2023-03-06 ENCOUNTER — Inpatient Hospital Stay (HOSPITAL_COMMUNITY): Payer: Medicaid Other

## 2023-03-06 ENCOUNTER — Encounter (HOSPITAL_COMMUNITY): Payer: Self-pay | Admitting: Obstetrics & Gynecology

## 2023-03-06 ENCOUNTER — Inpatient Hospital Stay (HOSPITAL_COMMUNITY)
Admission: RE | Admit: 2023-03-06 | Discharge: 2023-03-09 | DRG: 806 | Disposition: A | Discharge status: Home / Self Care | Payer: Medicaid Other | Attending: Obstetrics and Gynecology | Admitting: Obstetrics and Gynecology

## 2023-03-06 DIAGNOSIS — Z5986 Financial insecurity: Secondary | ICD-10-CM

## 2023-03-06 DIAGNOSIS — O480 Post-term pregnancy: Secondary | ICD-10-CM | POA: Diagnosis not present

## 2023-03-06 DIAGNOSIS — Z30430 Encounter for insertion of intrauterine contraceptive device: Secondary | ICD-10-CM | POA: Diagnosis not present

## 2023-03-06 DIAGNOSIS — Z3A40 40 weeks gestation of pregnancy: Secondary | ICD-10-CM | POA: Diagnosis not present

## 2023-03-06 DIAGNOSIS — O99334 Smoking (tobacco) complicating childbirth: Secondary | ICD-10-CM | POA: Diagnosis present

## 2023-03-06 DIAGNOSIS — F17290 Nicotine dependence, other tobacco product, uncomplicated: Secondary | ICD-10-CM | POA: Diagnosis present

## 2023-03-06 DIAGNOSIS — A6000 Herpesviral infection of urogenital system, unspecified: Secondary | ICD-10-CM | POA: Diagnosis present

## 2023-03-06 DIAGNOSIS — E669 Obesity, unspecified: Secondary | ICD-10-CM | POA: Diagnosis present

## 2023-03-06 DIAGNOSIS — Z3403 Encounter for supervision of normal first pregnancy, third trimester: Secondary | ICD-10-CM | POA: Diagnosis not present

## 2023-03-06 DIAGNOSIS — O9832 Other infections with a predominantly sexual mode of transmission complicating childbirth: Secondary | ICD-10-CM | POA: Diagnosis present

## 2023-03-06 DIAGNOSIS — Z3009 Encounter for other general counseling and advice on contraception: Secondary | ICD-10-CM

## 2023-03-06 DIAGNOSIS — O99824 Streptococcus B carrier state complicating childbirth: Secondary | ICD-10-CM | POA: Diagnosis present

## 2023-03-06 DIAGNOSIS — Z7982 Long term (current) use of aspirin: Secondary | ICD-10-CM

## 2023-03-06 DIAGNOSIS — O99820 Streptococcus B carrier state complicating pregnancy: Secondary | ICD-10-CM | POA: Diagnosis not present

## 2023-03-06 DIAGNOSIS — O99214 Obesity complicating childbirth: Secondary | ICD-10-CM | POA: Diagnosis present

## 2023-03-06 DIAGNOSIS — Z3400 Encounter for supervision of normal first pregnancy, unspecified trimester: Secondary | ICD-10-CM

## 2023-03-06 DIAGNOSIS — O9812 Syphilis complicating childbirth: Secondary | ICD-10-CM | POA: Diagnosis not present

## 2023-03-06 LAB — CBC
HCT: 38.3 % (ref 36.0–46.0)
Hemoglobin: 12.2 g/dL (ref 12.0–15.0)
MCH: 25.4 pg — ABNORMAL LOW (ref 26.0–34.0)
MCHC: 31.9 g/dL (ref 30.0–36.0)
MCV: 79.6 fL — ABNORMAL LOW (ref 80.0–100.0)
Platelets: 158 10*3/uL (ref 150–400)
RBC: 4.81 MIL/uL (ref 3.87–5.11)
RDW: 14.8 % (ref 11.5–15.5)
WBC: 6.1 10*3/uL (ref 4.0–10.5)
nRBC: 0 % (ref 0.0–0.2)

## 2023-03-06 LAB — TYPE AND SCREEN
ABO/RH(D): O POS
Antibody Screen: NEGATIVE

## 2023-03-06 MED ORDER — OXYTOCIN BOLUS FROM INFUSION
333.0000 mL | Freq: Once | INTRAVENOUS | Status: AC
  Administered 2023-03-08: 333 mL via INTRAVENOUS

## 2023-03-06 MED ORDER — ONDANSETRON HCL 4 MG/2ML IJ SOLN: 4.0000 mg | Freq: Four times a day (QID) | INTRAMUSCULAR | Status: DC | PRN

## 2023-03-06 MED ORDER — FLEET ENEMA 7-19 GM/118ML RE ENEM: 1.0000 | ENEMA | RECTAL | Status: DC | PRN

## 2023-03-06 MED ORDER — OXYCODONE-ACETAMINOPHEN 5-325 MG PO TABS: 1.0000 | ORAL_TABLET | ORAL | Status: DC | PRN

## 2023-03-06 MED ORDER — LIDOCAINE HCL (PF) 1 % IJ SOLN: 30.0000 mL | INTRAMUSCULAR | Status: DC | PRN

## 2023-03-06 MED ORDER — HYDROXYZINE HCL 50 MG PO TABS: 50.0000 mg | ORAL_TABLET | Freq: Four times a day (QID) | ORAL | Status: DC | PRN

## 2023-03-06 MED ORDER — MISOPROSTOL 50MCG HALF TABLET
50.0000 ug | ORAL_TABLET | Freq: Once | ORAL | Status: AC
  Administered 2023-03-06: 50 ug via ORAL
  Filled 2023-03-06: qty 1

## 2023-03-06 MED ORDER — PENICILLIN G POT IN DEXTROSE 60000 UNIT/ML IV SOLN
3.0000 10*6.[IU] | INTRAVENOUS | Status: DC
  Administered 2023-03-07 – 2023-03-08 (×7): 3 10*6.[IU] via INTRAVENOUS
  Filled 2023-03-06 (×7): qty 50

## 2023-03-06 MED ORDER — LACTATED RINGERS IV SOLN: INTRAVENOUS | Status: DC

## 2023-03-06 MED ORDER — LACTATED RINGERS IV SOLN: 500.0000 mL | INTRAVENOUS | Status: DC | PRN

## 2023-03-06 MED ORDER — TERBUTALINE SULFATE 1 MG/ML IJ SOLN: 0.2500 mg | Freq: Once | INTRAMUSCULAR | Status: DC | PRN

## 2023-03-06 MED ORDER — ACETAMINOPHEN 325 MG PO TABS: 650.0000 mg | ORAL_TABLET | ORAL | Status: DC | PRN

## 2023-03-06 MED ORDER — SOD CITRATE-CITRIC ACID 500-334 MG/5ML PO SOLN: 30.0000 mL | ORAL | Status: DC | PRN

## 2023-03-06 MED ORDER — SODIUM CHLORIDE 0.9 % IV SOLN
5.0000 10*6.[IU] | Freq: Once | INTRAVENOUS | Status: AC
  Administered 2023-03-06: 5 10*6.[IU] via INTRAVENOUS
  Filled 2023-03-06: qty 5

## 2023-03-06 MED ORDER — FENTANYL CITRATE (PF) 100 MCG/2ML IJ SOLN
100.0000 ug | INTRAMUSCULAR | Status: DC | PRN
  Administered 2023-03-07 (×2): 100 ug via INTRAVENOUS
  Filled 2023-03-06 (×2): qty 2

## 2023-03-06 MED ORDER — OXYCODONE-ACETAMINOPHEN 5-325 MG PO TABS: 2.0000 | ORAL_TABLET | ORAL | Status: DC | PRN

## 2023-03-06 MED ORDER — MISOPROSTOL 25 MCG QUARTER TABLET
25.0000 ug | ORAL_TABLET | Freq: Once | ORAL | Status: AC
  Administered 2023-03-06: 25 ug via VAGINAL
  Filled 2023-03-06: qty 1

## 2023-03-06 MED ORDER — OXYTOCIN-SODIUM CHLORIDE 30-0.9 UT/500ML-% IV SOLN
2.5000 [IU]/h | INTRAVENOUS | Status: DC
  Filled 2023-03-06: qty 500

## 2023-03-06 NOTE — H&P (Cosign Needed Addendum)
OBSTETRIC ADMISSION HISTORY AND PHYSICAL  Alejandra Diaz is a 27 y.o. female G1P0000 with IUP at [redacted]w[redacted]d by LMP c/w 8wk [place] presenting for IOL for elevated BMI and post dates. She reports  +FMs, No CTX, LOF, no VB, no blurry vision, headaches or peripheral edema, and RUQ pain.  She plans on both bottle and breast feeding. She request POPs for birth control. She received her prenatal care at [facility].  Dating: By LMP --->  Estimated Date of Delivery: 03/04/23  Sono:    @[redacted]w[redacted]d , CWD, normal anatomy, cephalic presentation, posterior placenta, 77% EFW  @[redacted]w[redacted]d , posterior placenta, EFW 68%, AFI 14, normal AFI.  Prenatal History/Complications:  - elevated BMI - + RPR in this pregnancy, treated - GBS positive - post dates   Past Medical History: Past Medical History:  Diagnosis Date   Chlamydia    Contraceptive management 11/06/2015   Herpes simplex virus (HSV) infection    History of chlamydia 12/04/2015   Obesity     Past Surgical History: Past Surgical History:  Procedure Laterality Date   EXTERNAL EAR SURGERY     Keloids removed   SALIVARY GLAND SURGERY     removed    Obstetrical History: OB History     Gravida  1   Para  0   Term  0   Preterm  0   AB  0   Living  0      SAB  0   IAB  0   Ectopic  0   Multiple  0   Live Births              Social History Social History   Socioeconomic History   Marital status: Single    Spouse name: Not on file   Number of children: Not on file   Years of education: Not on file   Highest education level: Some college, no degree  Occupational History   Not on file  Tobacco Use   Smoking status: Every Day    Types: Cigars   Smokeless tobacco: Never  Vaping Use   Vaping Use: Never used  Substance and Sexual Activity   Alcohol use: Not Currently   Drug use: Not Currently    Types: Marijuana   Sexual activity: Yes    Birth control/protection: None  Other Topics Concern   Not on file  Social History  Narrative   Not on file   Social Determinants of Health   Financial Resource Strain: Medium Risk (08/20/2022)   Overall Financial Resource Strain (CARDIA)    Difficulty of Paying Living Expenses: Somewhat hard  Food Insecurity: No Food Insecurity (03/06/2023)   Hunger Vital Sign    Worried About Running Out of Food in the Last Year: Never true    Ran Out of Food in the Last Year: Never true  Transportation Needs: No Transportation Needs (03/06/2023)   PRAPARE - Hydrologist (Medical): No    Lack of Transportation (Non-Medical): No  Physical Activity: Insufficiently Active (08/20/2022)   Exercise Vital Sign    Days of Exercise per Week: 5 days    Minutes of Exercise per Session: 10 min  Stress: No Stress Concern Present (08/20/2022)   Wall    Feeling of Stress : Only a little  Social Connections: Socially Isolated (08/20/2022)   Social Connection and Isolation Panel [NHANES]    Frequency of Communication with Friends and Family: Once a week  Frequency of Social Gatherings with Friends and Family: Once a week    Attends Religious Services: 1 to 4 times per year    Active Member of [employer] or Organizations: No    Attends Music therapist: Never    Marital Status: Never married    Family History: Family History  Problem Relation Age of Onset   Other Maternal Grandmother        on dialysis   COPD Paternal Grandmother     Allergies: No Known Allergies  Medications Prior to Admission  Medication Sig Dispense Refill Last Dose   aspirin 81 MG chewable tablet Chew 2 tablets (162 mg total) by mouth daily. 60 tablet 7    Prenatal Vit-Fe Fumarate-FA (PRENATAL VITAMIN PO) Take by mouth.        Review of Systems   All systems reviewed and negative except as stated in HPI  Blood pressure 108/77, pulse 89, temperature 97.9 F (36.6 C), temperature source Oral, resp. rate 18,  height 5\' 7"  (1.702 m), weight 124.2 kg, last menstrual period 05/28/2022. General appearance: alert, cooperative, and no distress Lungs: clear to auscultation bilaterally Heart: normal rate Abdomen: soft, non-tender; bowel sounds normal Presentation: cephalic by sutures Fetal monitoring baseline 150, mod variability, + accel, - decel Uterine activityNone   1/70/-3 by Dr. Caron Presume  Prenatal labs: ABO, Rh: --/--/PENDING (03/17 1901) Antibody: PENDING (03/17 1901) Rubella: 1.83 (09/05 1226) RPR: Reactive (12/18 [id number])  HBsAg: Negative (09/05 1226)  HIV: Non Reactive (12/18 [id number])  GBS: Positive/-- (02/22 1100)  2 hr Glucola 74/131/88 Genetic screening : NT/IT:  unable to obtain NT   AFP: did not have blood drawn     Panorama: low risk female Anatomy US normal  Prenatal Transfer Tool  Maternal Diabetes: No Genetic Screening: Normal Maternal Ultrasounds/Referrals: Normal Fetal Ultrasounds or other Referrals:  None Maternal Substance Abuse:  No Significant Maternal Medications:  None Significant Maternal Lab Results:  Group B Strep positive Number of Prenatal Visits:greater than 3 verified prenatal visits Other Comments:  None  Results for orders placed or performed during the hospital encounter of 03/06/23 (from the past 24 hour(s))  CBC   Collection Time: 03/06/23  7:01 PM  Result Value Ref Range   WBC 6.1 4.0 - 10.5 K/uL   RBC 4.81 3.87 - 5.11 MIL/uL   Hemoglobin 12.2 12.0 - 15.0 g/dL   HCT 38.3 36.0 - 46.0 %   MCV 79.6 (L) 80.0 - 100.0 fL   MCH 25.4 (L) 26.0 - 34.0 pg   MCHC 31.9 30.0 - 36.0 g/dL   RDW 14.8 11.5 - 15.5 %   Platelets 158 150 - 400 K/uL   nRBC 0.0 0.0 - 0.2 %  Type and screen   Collection Time: 03/06/23  7:01 PM  Result Value Ref Range   ABO/RH(D) PENDING    Antibody Screen PENDING    Sample Expiration      03/09/2023,2359 Performed at [facility], 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 G1P0000 at [redacted]w[redacted]d here for IOL for elevated BMI and post dates.   #Labor: foley placed filled to 40cc. Cytotec 50PO  / 25 vag.  #Pain: Desires epidural later #  FWB: Cat 1 #ID:  GBS pos on PCN #MOF: both #MOC:ppIUD #Circ:  desired  Seen and discussed with Dr. Concepcion Living. Jiayu "Darlyne Russian, M.D. PGY-2 Family Medicine Visiting Resident Faculty Practice 03/06/2023 8:02 PM   Fellow Attestation  I saw and evaluated the patient, performing the key elements of the service.I  personally performed or re-performed the history, physical exam, and medical decision making activities of this service and have verified that the service and findings are accurately documented in the resident's note. I developed the management plan that is described in the resident's note, and I agree with the content, with my edits above.    Gifford Shave, MD OB Fellow

## 2023-03-07 ENCOUNTER — Inpatient Hospital Stay (HOSPITAL_COMMUNITY): Payer: Medicaid Other | Admitting: Anesthesiology

## 2023-03-07 LAB — RPR
RPR Ser Ql: REACTIVE — AB
RPR Titer: 1:4 {titer}

## 2023-03-07 MED ORDER — MISOPROSTOL 25 MCG QUARTER TABLET
25.0000 ug | ORAL_TABLET | Freq: Once | ORAL | Status: AC
  Administered 2023-03-07: 25 ug via VAGINAL

## 2023-03-07 MED ORDER — DIPHENHYDRAMINE HCL 50 MG/ML IJ SOLN: 12.5000 mg | INTRAMUSCULAR | Status: DC | PRN

## 2023-03-07 MED ORDER — OXYTOCIN-SODIUM CHLORIDE 30-0.9 UT/500ML-% IV SOLN
1.0000 m[IU]/min | INTRAVENOUS | Status: DC
  Administered 2023-03-07: 2 m[IU]/min via INTRAVENOUS
  Filled 2023-03-07: qty 500

## 2023-03-07 MED ORDER — EPHEDRINE 5 MG/ML INJ: 10.0000 mg | INTRAVENOUS | Status: DC | PRN

## 2023-03-07 MED ORDER — LEVONORGESTREL 20 MCG/DAY IU IUD
1.0000 | INTRAUTERINE_SYSTEM | Freq: Once | INTRAUTERINE | Status: DC
  Filled 2023-03-07: qty 1

## 2023-03-07 MED ORDER — TERBUTALINE SULFATE 1 MG/ML IJ SOLN: 0.2500 mg | Freq: Once | INTRAMUSCULAR | Status: DC | PRN

## 2023-03-07 MED ORDER — MISOPROSTOL 50MCG HALF TABLET
50.0000 ug | ORAL_TABLET | Freq: Once | ORAL | Status: AC
  Administered 2023-03-07: 50 ug via ORAL
  Filled 2023-03-07: qty 1

## 2023-03-07 MED ORDER — LACTATED RINGERS IV SOLN: 500.0000 mL | Freq: Once | INTRAVENOUS | Status: DC

## 2023-03-07 MED ORDER — FENTANYL-BUPIVACAINE-NACL 0.5-0.125-0.9 MG/250ML-% EP SOLN
12.0000 mL/h | EPIDURAL | Status: DC | PRN
  Filled 2023-03-07: qty 250

## 2023-03-07 MED ORDER — PHENYLEPHRINE 80 MCG/ML (10ML) SYRINGE FOR IV PUSH (FOR BLOOD PRESSURE SUPPORT): 80.0000 ug | PREFILLED_SYRINGE | INTRAVENOUS | Status: DC | PRN

## 2023-03-07 NOTE — Anesthesia Preprocedure Evaluation (Signed)
Anesthesia Evaluation  Patient identified by MRN, date of birth, ID band Patient awake    Reviewed: Allergy & Precautions, Patient's Chart, lab work & pertinent test results  Airway Mallampati: III  TM Distance: >3 FB Neck ROM: Full    Dental no notable dental hx.    Pulmonary neg pulmonary ROS, Current Smoker   Pulmonary exam normal breath sounds clear to auscultation       Cardiovascular negative cardio ROS Normal cardiovascular exam Rhythm:Regular Rate:Normal     Neuro/Psych negative neurological ROS     GI/Hepatic negative GI ROS, Neg liver ROS,,,  Endo/Other    Morbid obesity  Renal/GU negative Renal ROS     Musculoskeletal negative musculoskeletal ROS (+)    Abdominal  (+) + obese  Peds  Hematology negative hematology ROS (+)   Anesthesia Other Findings   Reproductive/Obstetrics (+) Pregnancy                             Anesthesia Physical Anesthesia Plan  ASA:   Anesthesia Plan: Epidural   Post-op Pain Management:    Induction:   PONV Risk Score and Plan:   Airway Management Planned:   Additional Equipment:   Intra-op Plan:   Post-operative Plan:   Informed Consent: I have reviewed the patients History and Physical, chart, labs and discussed the procedure including the risks, benefits and alternatives for the proposed anesthesia with the patient or authorized representative who has indicated his/her understanding and acceptance.       Plan Discussed with:   Anesthesia Plan Comments:        Anesthesia Quick Evaluation

## 2023-03-07 NOTE — Progress Notes (Addendum)
Labor Progress Note Alejandra Diaz is a 27 y.o. G1P0000 at [redacted]w[redacted]d presented for IOL.   S: No acute concerns. Plans to take a nap.    O:  BP 114/64   Pulse 83   Temp 99.1 F (37.3 C) (Oral)   Resp 18   Ht 5\' 7"  (1.702 m)   Wt 124.2 kg   LMP 05/28/2022   SpO2 99%   BMI 42.90 kg/m   EFM: 125 bpm/moderate/+accels, no decels  CVE: Dilation: 4 Effacement (%): 80 Cervical Position: Anterior Station: -1 Presentation: Vertex Exam by:: Ndulue, MD   A&P: 27 y.o. G1P0000 [redacted]w[redacted]d here for IOL 2/2 BMI, PD.  #Labor: Continues to be in latent phase labor s/p AROM - light meconium at 11AM. She has been 4cm since 11AM. Pit was started at 9AM. IUPC placed around 8pm. MVUs are 155 on 18 of pit. Continue to titrate up on pitocin. Next cervical exam around 11PM #Pain: Epidural #FWB: Cat 1, #GBS positive - adequate ppx with PCN  Primary syphilis - treated prenatally with 3 doses of bicillin.  Stable titres at 1:4. Per discussion with ID, OK to not re-treat mom. Peds will evaluate baby after delivery and determine need for further work up.   [name], DO OB Fellow, [place] for [place] 03/07/2023, 10:16 PM

## 2023-03-07 NOTE — Anesthesia Procedure Notes (Signed)
Epidural Patient location during procedure: OB Start time: 03/07/2023 2:06 PM End time: 03/07/2023 2:23 PM  Staffing Anesthesiologist: Nolon Nations, MD Performed: anesthesiologist   Preanesthetic Checklist Completed: patient identified, IV checked, risks and benefits discussed, monitors and equipment checked, pre-op evaluation and timeout performed  Epidural Patient position: sitting Prep: DuraPrep and site prepped and draped Patient monitoring: heart rate, continuous pulse ox and blood pressure Approach: midline Location: L3-L4 Injection technique: LOR air and LOR saline  Needle:  Needle type: Tuohy  Needle gauge: 17 G Needle length: 9 cm Needle insertion depth: 7 cm Catheter type: closed end flexible Catheter size: 19 Gauge Catheter at skin depth: 13 cm Test dose: negative  Assessment Sensory level: T8 Events: blood not aspirated, no cerebrospinal fluid, injection not painful, no injection resistance, no paresthesia and negative IV test  Additional Notes Reason for block:procedure for pain

## 2023-03-07 NOTE — Progress Notes (Signed)
Labor Progress Note Alejandra Diaz is a 27 y.o. G1P0000 at [redacted]w[redacted]d presented for IOL.   S: doing well. Has gotten some rest  O:  BP 100/84   Pulse 77   Temp 98.9 F (37.2 C)   Resp 18   Ht 5\' 7"  (1.702 m)   Wt 124.2 kg   LMP 05/28/2022   SpO2 99%   BMI 42.90 kg/m   EFM: 135 bpm, moderate variability, + accelerations, no decelerations  CVE: Dilation: 4 Effacement (%): 80 Cervical Position: Anterior Station: -1 Presentation: Vertex Exam by:: Jahnasia Tatum, MD   A&P: 27 y.o. G1P0000 [redacted]w[redacted]d here for IOL 2/2 BMI, PD.  #Labor: s/p cytotec X 3 rounds, FB, AROM - light meconium. On pitocin at 16 units. Difficulty tracing contractions, so IUPC placed to allow for adequate titration of pitocin. #Pain: epidural in place #FWB: Cat 1, #GBS positive - adequate ppx with PCN  Primary syphilis - treated prenatally with 3 doses of bicillin.  Stable titres at 1:4. Per discussion with ID, OK to not re-treat mom. Peds will evaluate baby after delivery and determine need for further work up.   Liliane Channel MD MPH OB Fellow, [place] for [place] 03/07/2023

## 2023-03-07 NOTE — Progress Notes (Signed)
Labor Progress Note Jesselyn Naab is a 27 y.o. G1P0000 at [redacted]w[redacted]d presented for IOL.   S: doing well, feels light cramping with contractions.  O:  BP 134/85   Pulse 73   Temp 98.9 F (37.2 C) (Oral)   Resp 16   Ht 5\' 7"  (1.702 m)   Wt 124.2 kg   LMP 05/28/2022   BMI 42.90 kg/m   EFM: 130bpm. Moderate variability, +accels, no decels  CVE: Dilation: 4 Effacement (%): 70, 80 Station: -2 Presentation: Vertex Exam by:: Dr. Gar Ponto   A&P: 27 y.o. G1P0000 [redacted]w[redacted]d here for IOL 2/2 BMI, PD.  #Labor: s/p cytotec X 3 rounds, FB. Now on pitocin. AROM performed with clear fluid.  #Pain: maternally supported, will consider IV fentayl and epidural as needed. #FWB: Cat I  #GBS positive  Primary syphilis -treated prenatally - RPR titres have remained stable at 1:4 since treatment > 6 months ago. Discussed with Dr Dione Plover.Consult with ID to discuss recommendations concerning baby, vs repeating treatment.   [place] Nordquist Sherrilyn Rist, MD 12:32 PM

## 2023-03-07 NOTE — Progress Notes (Signed)
Labor Progress Note Keerti Rocker is a 27 y.o. G1P0000 at [redacted]w[redacted]d presented for IOL.   S: patient seen. No concerns. Just got epidural and is sleeping comfortably, so did not wake her up.  O:  BP (!) 118/93   Pulse 85   Temp 98.9 F (37.2 C) (Oral)   Resp 16   Ht 5\' 7"  (1.702 m)   Wt 124.2 kg   LMP 05/28/2022   SpO2 99%   BMI 42.90 kg/m   EFM: 130 bpm, moderate variability, + accelerations, intermittent variable decelerations  CVE: Dilation: 4 Effacement (%): 80 Station: -2, -1 Presentation: Vertex Exam by:: S WHill, RN   A&P: 27 y.o. G1P0000 [redacted]w[redacted]d here for IOL 2/2 BMI, PD.  #Labor: s/p cytotec X 3 rounds, FB, AROM - light meconium. On pitocin at 14 units.  #Pain: epidural in place #FWB: Cat 2, due to intermittent variable decels, but overall reassuring with spontaneous accels and excellent variability #GBS positive - adequate ppx with PCN  Primary syphilis - treated prenatally with 3 doses of bicillin.  Stable titres at 1:4. Per discussion with ID, OK to not re-treat mom. Peds will evaluate baby after delivery and determine need for further work up.   Liliane Channel MD MPH OB Fellow, [place] for [place] 03/07/2023

## 2023-03-07 NOTE — Progress Notes (Signed)
Labor Progress Note Alejandra Diaz is a 27 y.o. G1P0000 at [redacted]w[redacted]d presented for IOL.   S: No acute concerns.   O:  BP 124/66   Pulse 84   Temp 98 F (36.7 C) (Oral)   Resp 18   Ht 5\' 7"  (1.702 m)   Wt 124.2 kg   LMP 05/28/2022   BMI 42.90 kg/m  EFM: 155bpm/moderate/+accels, no decels  CVE: Dilation: 1 Effacement (%): 70 Station: -3 Presentation: Vertex Exam by:: Iva Boop, MD   A&P: 27 y.o. G1P0000 [redacted]w[redacted]d here for IOL 2/2 BMI, PD.  #Labor: FB in place, RN placed FB on traction. Will give oral cytotec 50 mcg. Assess again in 4 hours or sooner if FB becomes dislodged.  #Pain: maternally supported #FWB: Cat I  #GBS positive  Primary syphilis -treated prenatally -f/u RPR   Alpheus Stiff Autry-Lott, DO 12:52 AM

## 2023-03-07 NOTE — Progress Notes (Signed)
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 Given lack of serological response will curbside ID to see if this indicates treatment failure as this would have significant implications for management of the neonate.   11:42 AM   Clarnce Flock, MD/MPH Attending Family Medicine Physician, [facility] for [facility], [place]

## 2023-03-07 NOTE — Progress Notes (Signed)
Labor Progress Note Alejandra Diaz is a 27 y.o. G1P0000 at [redacted]w[redacted]d presented for IOL.   S: No acute concerns.   O:  BP 126/67   Pulse 84   Temp 99.1 F (37.3 C) (Oral)   Resp 18   Ht 5\' 7"  (1.702 m)   Wt 124.2 kg   LMP 05/28/2022   SpO2 99%   BMI 42.90 kg/m   EFM: 135 bpm/moderate/+accels, early decels  CVE: Dilation: 9 Effacement (%): 90 Cervical Position: Anterior Station: -1, 0 Presentation: Vertex Exam by:: Autry-Lott DO   A&P: 27 y.o. G1P0000 [redacted]w[redacted]d here for IOL 2/2 BMI, PD.  #Labor: Progressing well.  #Pain: Epidural #FWB: Cat 1, #GBS positive - adequate ppx with PCN  Primary syphilis - treated prenatally with 3 doses of bicillin.  Stable titres at 1:4. Per discussion with ID, OK to not re-treat mom. Peds will evaluate baby after delivery and determine need for further work up.   [name], DO OB Fellow, [place] for [place] 03/07/2023, 11:30 PM

## 2023-03-07 NOTE — Progress Notes (Signed)
Patient noted to have been diagnosed with syphilis during initial prenatal care, was treated with 3 doses of bicillin over 3 weeks, but has had stable titres at 1:4 since then, about 6 months ago.  I had a curbside consult with Dr Tommy Medal, with the infectious disease team. Given relatively low titres, and no increase in titres at all since treatment, he recommended against re-treating the patient. There may be a possibility that this was an older infection, hence the lingering low titre levels.   For the baby, there is less concern for congenital syphilis, given mom was treated fully and >30 days prior to delivery, but will have pediatric team evaluate following delivery and make recommendations about possible need for work up or treatment.   Liliane Channel MD MPH OB Fellow, [place] for [place] 03/07/2023

## 2023-03-08 ENCOUNTER — Other Ambulatory Visit: Payer: Self-pay

## 2023-03-08 ENCOUNTER — Encounter (HOSPITAL_COMMUNITY): Payer: Self-pay | Admitting: Obstetrics & Gynecology

## 2023-03-08 DIAGNOSIS — O99214 Obesity complicating childbirth: Secondary | ICD-10-CM | POA: Diagnosis not present

## 2023-03-08 DIAGNOSIS — Z3009 Encounter for other general counseling and advice on contraception: Secondary | ICD-10-CM

## 2023-03-08 DIAGNOSIS — Z30430 Encounter for insertion of intrauterine contraceptive device: Secondary | ICD-10-CM | POA: Diagnosis not present

## 2023-03-08 DIAGNOSIS — O480 Post-term pregnancy: Secondary | ICD-10-CM | POA: Diagnosis not present

## 2023-03-08 DIAGNOSIS — O99820 Streptococcus B carrier state complicating pregnancy: Secondary | ICD-10-CM | POA: Diagnosis not present

## 2023-03-08 DIAGNOSIS — O9812 Syphilis complicating childbirth: Secondary | ICD-10-CM | POA: Diagnosis not present

## 2023-03-08 DIAGNOSIS — Z3A40 40 weeks gestation of pregnancy: Secondary | ICD-10-CM

## 2023-03-08 HISTORY — PX: IUD INSERTION: OBO1003

## 2023-03-08 LAB — T.PALLIDUM AB, TOTAL: T Pallidum Abs: REACTIVE — AB

## 2023-03-08 LAB — CBC
HCT: 32.1 % — ABNORMAL LOW (ref 36.0–46.0)
Hemoglobin: 10.4 g/dL — ABNORMAL LOW (ref 12.0–15.0)
MCH: 25.2 pg — ABNORMAL LOW (ref 26.0–34.0)
MCHC: 32.4 g/dL (ref 30.0–36.0)
MCV: 77.9 fL — ABNORMAL LOW (ref 80.0–100.0)
Platelets: 133 10*3/uL — ABNORMAL LOW (ref 150–400)
RBC: 4.12 MIL/uL (ref 3.87–5.11)
RDW: 14.7 % (ref 11.5–15.5)
WBC: 12.7 10*3/uL — ABNORMAL HIGH (ref 4.0–10.5)
nRBC: 0 % (ref 0.0–0.2)

## 2023-03-08 MED ORDER — COCONUT OIL OIL: 1.0000 | TOPICAL_OIL | Status: DC | PRN

## 2023-03-08 MED ORDER — ZOLPIDEM TARTRATE 5 MG PO TABS: 5.0000 mg | ORAL_TABLET | Freq: Every evening | ORAL | Status: DC | PRN

## 2023-03-08 MED ORDER — BENZOCAINE-MENTHOL 20-0.5 % EX AERO
1.0000 | INHALATION_SPRAY | CUTANEOUS | Status: DC | PRN
  Filled 2023-03-08: qty 56

## 2023-03-08 MED ORDER — WITCH HAZEL-GLYCERIN EX PADS: 1.0000 | MEDICATED_PAD | CUTANEOUS | Status: DC | PRN

## 2023-03-08 MED ORDER — PRENATAL MULTIVITAMIN CH
1.0000 | ORAL_TABLET | Freq: Every day | ORAL | Status: DC
  Administered 2023-03-08 – 2023-03-09 (×2): 1 via ORAL
  Filled 2023-03-08 (×2): qty 1

## 2023-03-08 MED ORDER — IBUPROFEN 600 MG PO TABS
600.0000 mg | ORAL_TABLET | Freq: Four times a day (QID) | ORAL | Status: DC
  Administered 2023-03-08 – 2023-03-09 (×6): 600 mg via ORAL
  Filled 2023-03-08 (×6): qty 1

## 2023-03-08 MED ORDER — TETANUS-DIPHTH-ACELL PERTUSSIS 5-2.5-18.5 LF-MCG/0.5 IM SUSY
0.5000 mL | PREFILLED_SYRINGE | Freq: Once | INTRAMUSCULAR | Status: DC
  Filled 2023-03-08: qty 0.5

## 2023-03-08 MED ORDER — OXYTOCIN 10 UNIT/ML IJ SOLN
10.0000 [IU] | Freq: Once | INTRAMUSCULAR | Status: AC
  Administered 2023-03-08: 10 [IU] via INTRAMUSCULAR

## 2023-03-08 MED ORDER — ACETAMINOPHEN 325 MG PO TABS
650.0000 mg | ORAL_TABLET | ORAL | Status: DC | PRN
  Administered 2023-03-08: 650 mg via ORAL
  Filled 2023-03-08: qty 2

## 2023-03-08 MED ORDER — DIPHENHYDRAMINE HCL 25 MG PO CAPS: 25.0000 mg | ORAL_CAPSULE | Freq: Four times a day (QID) | ORAL | Status: DC | PRN

## 2023-03-08 MED ORDER — ONDANSETRON HCL 4 MG PO TABS
4.0000 mg | ORAL_TABLET | ORAL | Status: DC | PRN
  Filled 2023-03-08: qty 1

## 2023-03-08 MED ORDER — OXYTOCIN 10 UNIT/ML IJ SOLN
INTRAMUSCULAR | Status: AC
  Filled 2023-03-08: qty 1

## 2023-03-08 MED ORDER — SIMETHICONE 80 MG PO CHEW: 80.0000 mg | CHEWABLE_TABLET | ORAL | Status: DC | PRN

## 2023-03-08 MED ORDER — DIBUCAINE (PERIANAL) 1 % EX OINT: 1.0000 | TOPICAL_OINTMENT | CUTANEOUS | Status: DC | PRN

## 2023-03-08 MED ORDER — ONDANSETRON HCL 4 MG/2ML IJ SOLN: 4.0000 mg | INTRAMUSCULAR | Status: DC | PRN

## 2023-03-08 MED ORDER — SENNOSIDES-DOCUSATE SODIUM 8.6-50 MG PO TABS
2.0000 | ORAL_TABLET | Freq: Every day | ORAL | Status: DC
  Administered 2023-03-09: 2 via ORAL
  Filled 2023-03-08: qty 2

## 2023-03-08 NOTE — Discharge Summary (Signed)
     Postpartum Discharge Summary  Date of Service updated***     Patient Name: Alejandra Diaz DOB: 10-25-1996 MRN: [id number]  Date of admission: 03/06/2023 Delivery date:03/08/2023  Delivering provider: Gerlene Diaz  Date of discharge: 03/08/2023  Admitting diagnosis: Obesity [E66.9] Intrauterine pregnancy: [redacted]w[redacted]d     Secondary diagnosis:  Principal Problem:   Obesity  Additional problems: ***    Discharge diagnosis: {DX.:23714}                                              Post partum procedures:{Postpartum procedures:23558} Augmentation: AROM, Pitocin, Cytotec, and IP Foley Complications: None  Hospital course: Induction of Labor With Vaginal Delivery   27 y.o. yo G1P0000 at [redacted]w[redacted]d was admitted to the hospital 03/06/2023 for induction of labor.  Indication for induction: Postdates and high BMI .  Patient had an labor course complicated by prolonged latent phase labor s/p AROM.  Membrane Rupture Time/Date: 10:57 AM ,03/07/2023   Delivery Method:Vaginal, Spontaneous  Episiotomy:   Lacerations:  Labial;1st degree;Sulcus  Details of delivery can be found in separate delivery note.  Patient had a postpartum course complicated by***. Patient is discharged home 03/08/23.  Newborn Data: Birth date:03/08/2023  Birth time:3:00 AM  Gender:Female  Living status:Living  Apgars:8 ,9  Weight:   Magnesium Sulfate received: {Mag received:30440022} BMZ received: {BMZ received:30440023} Rhophylac:{Rhophylac received:30440032} MMR:{MMR:30440033} T-DaP:{Tdap:23962} Flu: WG:1132360 Transfusion:{Transfusion received:30440034}  Physical exam  Vitals:   03/08/23 0101 03/08/23 0131 03/08/23 0203 03/08/23 0231  BP: 117/61 (!) 107/52 132/81 129/75  Pulse: 84 80 81 84  Resp:      Temp:      TempSrc:      SpO2:      Weight:      Height:       General: {Exam; general:21111117} Lochia: {Desc; appropriate/inappropriate:30686::"appropriate"} Uterine Fundus: {Desc;  firm/soft:30687} Incision: {Exam; incision:21111123} DVT Evaluation: {Exam; dvt:2111122} Labs: Lab Results  Component Value Date   WBC 6.1 03/06/2023   HGB 12.2 03/06/2023   HCT 38.3 03/06/2023   MCV 79.6 (L) 03/06/2023   PLT 158 03/06/2023       No data to display         Edinburgh Score:     No data to display           After visit meds:  Allergies as of 03/08/2023   No Known Allergies   Med Rec must be completed prior to using this [facility]***        Discharge home in stable condition Infant Feeding: {Baby feeding:23562} Infant Disposition:{CHL IP OB HOME WITH DX:[id number] Discharge instruction: per After Visit Summary and Postpartum booklet. Activity: Advance as tolerated. Pelvic rest for 6 weeks.  Diet: {OB BY:630183 Future Appointments:No future appointments. Follow up Visit:  Message sent to FT by Diaz on 03/08/2023  Please schedule this patient for a In person postpartum visit in 6 weeks with the following provider: MD. Additional Postpartum F/U: RPR titers   High risk pregnancy complicated by:  Obesity, Syphilis treated in this pregnancy Delivery mode:  Vaginal, Spontaneous  Anticipated Birth Control:  PP IUD placed   03/08/2023 Alejandra Plummer Autry-Lott, DO

## 2023-03-08 NOTE — Progress Notes (Signed)
Patient declined signing up for baby scripts

## 2023-03-08 NOTE — Anesthesia Postprocedure Evaluation (Signed)
Anesthesia Post Note  Patient: Emmilynn Garlinger  Procedure(s) Performed: AN AD HOC LABOR EPIDURAL     Patient location during evaluation: Mother Baby Anesthesia Type: Epidural Level of consciousness: awake and alert Pain management: pain level controlled Vital Signs Assessment: post-procedure vital signs reviewed and stable Respiratory status: spontaneous breathing, nonlabored ventilation and respiratory function stable Cardiovascular status: stable Postop Assessment: no headache, no backache and epidural receding Anesthetic complications: no   No notable events documented.  Last Vitals:  Vitals:   03/08/23 0726 03/08/23 1100  BP: 107/61 112/68  Pulse: 91 78  Resp: 18 18  Temp: 36.8 C 36.9 C  SpO2: 99% 100%    Last Pain:  Vitals:   03/08/23 1100  TempSrc: Oral  PainSc:    Pain Goal:                   Miki Labuda

## 2023-03-08 NOTE — Lactation Note (Signed)
This note was copied from a baby's chart. Lactation Consultation Note  Patient Name: Alejandra Diaz [id number] Date: 03/08/2023 Age:28 hours Reason for consult: Initial assessment;Primapara;1st time breastfeeding;Term;Breastfeeding assistance Per mom would like to breast feed, but the baby can't latch due to the size of my nipple. LC offered to assess and noted medium sized nipple and semi compressible areola.  LC showed mom the reverse pressure exercise. And the tissue became more compressible. Attempted to latch, on and off due to the baby sucking is tongue. Finished the feeding with a bottle.  Mom had company is in the hallway and preferred to be set up after there visit.    Maternal Data Has patient been taught Hand Expression?: Yes Does the patient have breastfeeding experience prior to this delivery?: No  Feeding Mother's Current Feeding Choice: Breast Milk and Formula Nipple Type: Extra Slow Flow  LATCH Score Latch: Repeated attempts needed to sustain latch, nipple held in mouth throughout feeding, stimulation needed to elicit sucking reflex. (baby sucking on his tongue and evern after pacefeeding from a bottle , unable to latch the baby.)  Audible Swallowing: None  Type of Nipple: Everted at rest and after stimulation (areola edema)  Comfort (Breast/Nipple): Soft / non-tender  Hold (Positioning): Assistance needed to correctly position infant at breast and maintain latch.  LATCH Score: 6   Lactation Tools Discussed/Used    Interventions Interventions: Breast feeding basics reviewed;Assisted with latch;Skin to skin;Hand express;Reverse pressure;Support pillows;Adjust position;Position options;Education;LC Services brochure  Discharge Pump: Personal;DEBP  Consult Status Consult Status: Follow-up Date: 03/09/23 Follow-up type: [place] 03/08/2023, 7:33 PM

## 2023-03-08 NOTE — Procedures (Signed)
  Post-Placental IUD Insertion Procedure Note  Patient identified, informed consent signed prior to delivery, signed copy in chart, time out was performed.    Vaginal, labial and perineal areas thoroughly inspected for lacerations. Sulcul and right labial laceration identified - not hemostatic, not repaired prior to insertion of IUD.    Mirena  - IUD grasped between sterile gloved fingers. Fundus identified through abdominal wall using non-insertion hand. IUD inserted to fundus with bimanual technique. IUD carefully released at the fundus and insertion hand gently removed from vagina.    Strings trimmed to the level of the introitus. Patient tolerated procedure well.  Patient given post procedure instructions and IUD care card with expiration date.  Patient is asked to keep IUD strings tucked in her vagina until her postpartum follow up visit in 4-6 weeks. Patient advised to abstain from sexual intercourse and pulling on strings before her follow-up visit. Patient verbalized an understanding of the plan of care and agrees.

## 2023-03-09 ENCOUNTER — Other Ambulatory Visit (HOSPITAL_COMMUNITY): Payer: Self-pay

## 2023-03-09 DIAGNOSIS — Z30430 Encounter for insertion of intrauterine contraceptive device: Secondary | ICD-10-CM

## 2023-03-09 MED ORDER — LIDOCAINE-EPINEPHRINE (PF) 1.5 %-1:200000 IJ SOLN
INTRAMUSCULAR | Status: DC | PRN
  Administered 2023-03-07: 3 mL via EPIDURAL

## 2023-03-09 MED ORDER — SENNOSIDES-DOCUSATE SODIUM 8.6-50 MG PO TABS
2.0000 | ORAL_TABLET | Freq: Every day | ORAL | 0 refills | Status: AC
  Filled 2023-03-09: qty 30, 15d supply, fill #0

## 2023-03-09 MED ORDER — FENTANYL-BUPIVACAINE-NACL 0.5-0.125-0.9 MG/250ML-% EP SOLN
EPIDURAL | Status: DC | PRN
  Administered 2023-03-07: 12 mL/h via EPIDURAL

## 2023-03-09 MED ORDER — ACETAMINOPHEN 325 MG PO TABS
650.0000 mg | ORAL_TABLET | ORAL | 0 refills | Status: AC | PRN
  Filled 2023-03-09: qty 30, 3d supply, fill #0

## 2023-03-09 MED ORDER — BENZOCAINE-MENTHOL 20-0.5 % EX AERO
1.0000 | INHALATION_SPRAY | CUTANEOUS | 0 refills | Status: AC | PRN
  Filled 2023-03-09: qty 78, fill #0

## 2023-03-09 MED ORDER — POLYETHYLENE GLYCOL 3350 17 G PO PACK
17.0000 g | PACK | Freq: Every day | ORAL | Status: DC
  Administered 2023-03-09: 17 g via ORAL
  Filled 2023-03-09: qty 1

## 2023-03-09 MED ORDER — IBUPROFEN 600 MG PO TABS
600.0000 mg | ORAL_TABLET | Freq: Four times a day (QID) | ORAL | 0 refills | Status: AC
  Filled 2023-03-09: qty 30, 8d supply, fill #0

## 2023-03-09 NOTE — Plan of Care (Signed)
  Problem: Education: Goal: Knowledge of General Education information will improve Description: Including pain rating scale, medication(s)/side effects and non-pharmacologic comfort measures Outcome: Progressing   Problem: Health Behavior/Discharge Planning: Goal: Ability to manage health-related needs will improve Outcome: Progressing   Problem: Clinical Measurements: Goal: Ability to maintain clinical measurements within normal limits will improve Outcome: Progressing Goal: Will remain free from infection Outcome: Progressing Goal: Diagnostic test results will improve Outcome: Progressing Goal: Respiratory complications will improve Outcome: Progressing Goal: Cardiovascular complication will be avoided Outcome: Progressing   Problem: Coping: Goal: Level of anxiety will decrease Outcome: Progressing   Problem: Elimination: Goal: Will not experience complications related to bowel motility Outcome: Progressing   Problem: Pain Managment: Goal: General experience of comfort will improve Outcome: Progressing   Problem: Safety: Goal: Ability to remain free from injury will improve Outcome: Progressing   Problem: Skin Integrity: Goal: Risk for impaired skin integrity will decrease Outcome: Progressing   Problem: Education: Goal: Knowledge of Childbirth will improve Outcome: Progressing Goal: Ability to make informed decisions regarding treatment and plan of care will improve Outcome: Progressing Goal: Ability to state and carry out methods to decrease the pain will improve Outcome: Progressing Goal: Individualized Educational Video(s) Outcome: Progressing   Problem: Coping: Goal: Ability to verbalize concerns and feelings about labor and delivery will improve Outcome: Progressing   Problem: Life Cycle: Goal: Ability to make normal progression through stages of labor will improve Outcome: Progressing Goal: Ability to effectively push during vaginal delivery will  improve Outcome: Progressing   Problem: Role Relationship: Goal: Will demonstrate positive interactions with the child Outcome: Progressing   Problem: Safety: Goal: Risk of complications during labor and delivery will decrease Outcome: Progressing   Problem: Pain Management: Goal: Relief or control of pain from uterine contractions will improve Outcome: Progressing   Problem: Education: Goal: Knowledge of condition will improve Outcome: Progressing Goal: Individualized Educational Video(s) Outcome: Progressing Goal: Individualized Newborn Educational Video(s) Outcome: Progressing   Problem: Activity: Goal: Will verbalize the importance of balancing activity with adequate rest periods Outcome: Progressing Goal: Ability to tolerate increased activity will improve Outcome: Progressing   Problem: Coping: Goal: Ability to identify and utilize available resources and services will improve Outcome: Progressing   Problem: Life Cycle: Goal: Chance of risk for complications during the postpartum period will decrease Outcome: Progressing   Problem: Role Relationship: Goal: Ability to demonstrate positive interaction with newborn will improve Outcome: Progressing   Problem: Skin Integrity: Goal: Demonstration of wound healing without infection will improve Outcome: Progressing

## 2023-03-09 NOTE — Discharge Instructions (Signed)
NO SEX UNTIL AFTER YOUR POSTPARTUM VISIT

## 2023-03-09 NOTE — Addendum Note (Signed)
Addendum  created 03/09/23 1134 by Nolon Nations, MD   Clinical Note Signed, Intraprocedure Meds edited

## 2023-03-09 NOTE — Progress Notes (Addendum)
Post Partum Day 1 Subjective: Eating, drinking, voiding, ambulating well.  +flatus.  Lochia and pain wnl.  Denies dizziness, lightheadedness, or sob. No complaints. Wants to stay another night.   Objective: Blood pressure 120/74, pulse 72, temperature 98.2 F (36.8 C), temperature source Oral, resp. rate 18, height 5\' 7"  (1.702 m), weight 124.2 kg, last menstrual period 05/28/2022, SpO2 100 %, unknown if currently breastfeeding.  Physical Exam:  General: alert, cooperative, and no distress Lochia: appropriate Uterine Fundus: firm Incision: n/a DVT Evaluation: No evidence of DVT seen on physical exam. Negative Homan's sign. No cords or calf tenderness. No significant calf/ankle edema.  Recent Labs    03/06/23 1901 03/08/23 0644  HGB 12.2 10.4*  HCT 38.3 32.1*    Assessment/Plan: Plan for discharge tomorrow Breast & bottlefeeding PP IUD placed after delivery Wants circ prior to d/c   LOS: 3 days   Roma Schanz, CNM 03/09/2023, 6:36 AM   CNM Circumcision Counseling Progress Note  Patient desires circumcision for her female infant.  Circumcision procedure details discussed, risks and benefits of procedure were also discussed.  These include but are not limited to: Benefits of circumcision in men include reduction in the rates of urinary tract infection (UTI), penile cancer, some sexually transmitted infections, penile inflammatory and retractile disorders, as well as easier hygiene.  Risks include bleeding , infection, injury of glans which may lead to penile deformity or urinary tract issues, unsatisfactory cosmetic appearance and other potential complications related to the procedure.  It was emphasized that this is an elective procedure.  Patient wants to proceed with circumcision; written informed consent will be obtained.  Will have MD do circumcision when infant is cleared for such by peds.  Knute Neu, CNM, [facility] 03/09/2023   7:42 AM

## 2023-03-09 NOTE — Social Work (Signed)
CSW received consult for an [place] Postnatal Depression Screen score of 10.  CSW met with MOB to offer support and complete assessment. CSW entered the room and observed MOB tending the the infants needs and MGM at bedside. CSW introduced self, CSW role and reason for visit .MOB was agreeable to visit an allowed [facility] to remain in the room. CSW inquired about how MOB was feeling, MOB reported good. CSW inquired about MOB EPDS score and the past 7 days, MOB reported she was anxious about the delivery but no other concerns. CSW inquired about any MH diagnosis, MOB reported none. CSW assessed for safety, MOB denied any SI or HI. CSW provided education regarding the baby blues period vs. perinatal mood disorders, discussed treatment and gave resources for mental health follow up if concerns arise.  CSW recommends self-evaluation during the postpartum time period using the New Mom Checklist from Postpartum Progress and encouraged MOB to contact a medical professional if symptoms are noted at any time. MOB identified her boyfriend and family as her supports.  CSW provided review of Sudden Infant Death Syndrome (SIDS) precautions.  MOB identified [facility] for infants follow up care. MOB reported she has all necessary items for the infant including a bassinet and car seat.   CSW identifies no further need for intervention and no barriers to discharge at this time.  Letta Kocher, [place] Social Worker [phone]

## 2023-03-10 ENCOUNTER — Telehealth (HOSPITAL_COMMUNITY): Payer: Self-pay | Admitting: *Deleted

## 2023-03-10 DIAGNOSIS — Z1331 Encounter for screening for depression: Secondary | ICD-10-CM

## 2023-03-10 LAB — SURGICAL PATHOLOGY

## 2023-03-10 NOTE — Telephone Encounter (Signed)
Inpatient EPDS=10. CSW consult completed while in hospital. Today making ambulatory [facility] referral. Dr Ernestina Patches notified via chart.  Odis Hollingshead, RN 03-10-2023 at 9:08am

## 2023-03-21 ENCOUNTER — Telehealth: Payer: Self-pay | Admitting: Clinical

## 2023-03-21 NOTE — Telephone Encounter (Signed)
Attempt call regarding referral; Left HIPPA-compliant message to call back Jobe Mutch from [facility] at  [phone] (Reyden Smith's office).   

## 2023-03-22 ENCOUNTER — Telehealth (HOSPITAL_COMMUNITY): Payer: Self-pay | Admitting: *Deleted

## 2023-03-22 NOTE — Telephone Encounter (Signed)
Mom reports feeling good. No concerns about herself at this time. EPDS=4 [facility] score=10) Mom reports baby is doing well. Feeding, peeing, and pooping without difficulty. Safe sleep reviewed. Mom reports no concerns about baby at present.  Odis Hollingshead, RN 03-22-2023 at 3:22pm

## 2023-03-25 MED FILL — Levonorgestrel IUD 20 MCG/DAY (Initial) (52 MG Total): INTRAUTERINE | Qty: 1 | Status: AC

## 2023-03-28 ENCOUNTER — Telehealth: Payer: Self-pay

## 2023-03-28 NOTE — Telephone Encounter (Signed)
LMOVm returning patient's call.  

## 2023-03-28 NOTE — Telephone Encounter (Signed)
Patient called and stated that she needs to speak with a nurse ASAP.  She thinks she may need to be seen sooner.

## 2023-03-31 ENCOUNTER — Telehealth: Payer: Self-pay

## 2023-03-31 NOTE — Telephone Encounter (Signed)
Alejandra Diaz would like for you to give her a call.  Her Mychart is not working.

## 2023-03-31 NOTE — Telephone Encounter (Signed)
Returned patient's call.  Informed I had Dr Despina Hidden look over her chart and he states her titers can be rechecked at her postpartum visit.  Patient states her partner has gone to the health department today for treatment.  Advised to let us know if she has any problems or issues prior to her postpartum visit. Verbalized understanding with no further questions.

## 2023-04-12 ENCOUNTER — Ambulatory Visit: Payer: Medicaid Other | Admitting: Women's Health

## 2023-04-14 ENCOUNTER — Ambulatory Visit: Payer: Medicaid Other | Admitting: Advanced Practice Midwife

## 2023-06-21 DIAGNOSIS — N760 Acute vaginitis: Secondary | ICD-10-CM | POA: Diagnosis not present

## 2023-06-21 DIAGNOSIS — Z113 Encounter for screening for infections with a predominantly sexual mode of transmission: Secondary | ICD-10-CM | POA: Diagnosis not present

## 2023-06-21 DIAGNOSIS — B3731 Acute candidiasis of vulva and vagina: Secondary | ICD-10-CM | POA: Diagnosis not present

## 2023-06-21 DIAGNOSIS — Z114 Encounter for screening for human immunodeficiency virus [HIV]: Secondary | ICD-10-CM | POA: Diagnosis not present

## 2023-07-12 DIAGNOSIS — A539 Syphilis, unspecified: Secondary | ICD-10-CM | POA: Diagnosis not present

## 2023-12-05 ENCOUNTER — Emergency Department (HOSPITAL_COMMUNITY): Payer: Medicaid Other

## 2023-12-05 ENCOUNTER — Other Ambulatory Visit: Payer: Self-pay

## 2023-12-05 ENCOUNTER — Encounter (HOSPITAL_COMMUNITY): Payer: Self-pay | Admitting: Emergency Medicine

## 2023-12-05 ENCOUNTER — Emergency Department (HOSPITAL_COMMUNITY)
Admission: EM | Admit: 2023-12-05 | Discharge: 2023-12-05 | Disposition: A | Discharge status: Home / Self Care | Payer: Medicaid Other | Attending: Emergency Medicine | Admitting: Emergency Medicine

## 2023-12-05 DIAGNOSIS — R0602 Shortness of breath: Secondary | ICD-10-CM | POA: Diagnosis present

## 2023-12-05 DIAGNOSIS — J181 Lobar pneumonia, unspecified organism: Secondary | ICD-10-CM | POA: Insufficient documentation

## 2023-12-05 DIAGNOSIS — Z20822 Contact with and (suspected) exposure to covid-19: Secondary | ICD-10-CM | POA: Diagnosis not present

## 2023-12-05 DIAGNOSIS — J189 Pneumonia, unspecified organism: Secondary | ICD-10-CM

## 2023-12-05 LAB — COMPREHENSIVE METABOLIC PANEL
ALT: 15 U/L (ref 0–44)
AST: 16 U/L (ref 15–41)
Albumin: 3.9 g/dL (ref 3.5–5.0)
Alkaline Phosphatase: 48 U/L (ref 38–126)
Anion gap: 10 (ref 5–15)
BUN: 7 mg/dL (ref 6–20)
CO2: 23 mmol/L (ref 22–32)
Calcium: 9 mg/dL (ref 8.9–10.3)
Chloride: 106 mmol/L (ref 98–111)
Creatinine, Ser: 0.56 mg/dL (ref 0.44–1.00)
GFR, Estimated: >60 mL/min (ref >60)
Glucose, Bld: 99 mg/dL (ref 70–99)
Potassium: 3.7 mmol/L (ref 3.5–5.1)
Sodium: 139 mmol/L (ref 135–145)
Total Bilirubin: 0.5 mg/dL (ref <1.2)
Total Protein: 7.3 g/dL (ref 6.5–8.1)

## 2023-12-05 LAB — CBC
HCT: 42 % (ref 36.0–46.0)
Hemoglobin: 13.2 g/dL (ref 12.0–15.0)
MCH: 25 pg — ABNORMAL LOW (ref 26.0–34.0)
MCHC: 31.4 g/dL (ref 30.0–36.0)
MCV: 79.7 fL — ABNORMAL LOW (ref 80.0–100.0)
Platelets: 231 10*3/uL (ref 150–400)
RBC: 5.27 MIL/uL — ABNORMAL HIGH (ref 3.87–5.11)
RDW: 14.6 % (ref 11.5–15.5)
WBC: 9 10*3/uL (ref 4.0–10.5)
nRBC: 0 % (ref 0.0–0.2)

## 2023-12-05 LAB — RESP PANEL BY RT-PCR (RSV, FLU A&B, COVID)  RVPGX2
Influenza A by PCR: NEGATIVE
Influenza B by PCR: NEGATIVE
Resp Syncytial Virus by PCR: NEGATIVE
SARS Coronavirus 2 by RT PCR: NEGATIVE

## 2023-12-05 LAB — HCG, QUANTITATIVE, PREGNANCY: hCG, Beta Chain, Quant, S: <1 m[IU]/mL (ref <5)

## 2023-12-05 LAB — TROPONIN I (HIGH SENSITIVITY): Troponin I (High Sensitivity): 2 ng/L (ref <18)

## 2023-12-05 MED ORDER — PREDNISONE 50 MG PO TABS
60.0000 mg | ORAL_TABLET | Freq: Every day | ORAL | Status: DC
  Administered 2023-12-05: 60 mg via ORAL
  Filled 2023-12-05: qty 1

## 2023-12-05 MED ORDER — ALBUTEROL SULFATE HFA 108 (90 BASE) MCG/ACT IN AERS
1.0000 | INHALATION_SPRAY | Freq: Once | RESPIRATORY_TRACT | Status: AC
  Administered 2023-12-05: 1 via RESPIRATORY_TRACT
  Filled 2023-12-05: qty 6.7

## 2023-12-05 MED ORDER — AMOXICILLIN-POT CLAVULANATE 875-125 MG PO TABS: 1.0000 | ORAL_TABLET | Freq: Two times a day (BID) | ORAL | 0 refills | Status: AC

## 2023-12-05 MED ORDER — ALBUTEROL SULFATE (2.5 MG/3ML) 0.083% IN NEBU
2.5000 mg | INHALATION_SOLUTION | Freq: Once | RESPIRATORY_TRACT | Status: AC
  Administered 2023-12-05: 2.5 mg via RESPIRATORY_TRACT
  Filled 2023-12-05: qty 3

## 2023-12-05 MED ORDER — AZITHROMYCIN 250 MG PO TABS: 250.0000 mg | ORAL_TABLET | Freq: Every day | ORAL | 0 refills | Status: AC

## 2023-12-05 MED ORDER — IOHEXOL 350 MG/ML SOLN
80.0000 mL | Freq: Once | INTRAVENOUS | Status: AC | PRN
  Administered 2023-12-05: 75 mL via INTRAVENOUS

## 2023-12-05 NOTE — ED Provider Notes (Signed)
[place] EMERGENCY DEPARTMENT AT [facility] Provider Note   CSN: [id number] Arrival date & time: 12/05/23  7829     History  No chief complaint on file.   Alejandra Diaz is a 27 y.o. female patient without significant past medical history reporting to the emergency room with 4 days of productive cough, shortness of breath.  Patient reports that whenever she gets a viral illness she will start feeling like this. Reports sore throat with coughing. Denies history of asthma.  But she has noted wheezing.  Patient reports shortness of breath is worse when she is walking around. Any chest pain, chest tightness, fevers, chills.  HPI     Home Medications Prior to Admission medications   Medication Sig Start Date End Date Taking? Authorizing Provider  acetaminophen (TYLENOL) 325 MG tablet Take 2 tablets (650 mg total) by mouth every 4 (four) hours as needed (for pain scale < 4). 03/09/23   Mercado-Ortiz, Lahoma Crocker, DO  benzocaine-Menthol (DERMOPLAST) 20-0.5 % AERO Apply 1 Application topically as needed for irritation (perineal discomfort). 03/09/23   Mercado-Ortiz, Lahoma Crocker, DO  ibuprofen (ADVIL) 600 MG tablet Take 1 tablet (600 mg total) by mouth every 6 (six) hours. 03/09/23   Mercado-Ortiz, Lahoma Crocker, DO  senna-docusate (SENOKOT-S) 8.6-50 MG tablet Take 2 tablets by mouth daily. 03/10/23   Mercado-Ortiz, Lahoma Crocker, DO      Allergies    Patient has no known allergies.    Review of Systems   Review of Systems  Respiratory:  Positive for shortness of breath.     Physical Exam Updated Vital Signs There were no vitals taken for this visit. Physical Exam Vitals and nursing note reviewed.  Constitutional:      General: She is not in acute distress.    Appearance: She is not toxic-appearing.  HENT:     Head: Normocephalic and atraumatic.  Eyes:     General: No scleral icterus.    Conjunctiva/sclera: Conjunctivae normal.  Cardiovascular:     Rate and Rhythm: Normal rate  and regular rhythm.     Pulses: Normal pulses.     Heart sounds: Normal heart sounds.  Pulmonary:     Effort: Pulmonary effort is normal. No respiratory distress.     Breath sounds: Wheezing present.     Comments: Patient is in no acute distress.  Normal respiratory effort.  Diffuse wheezing on auscultation to lungs. Abdominal:     General: Abdomen is flat. Bowel sounds are normal.     Palpations: Abdomen is soft.     Tenderness: There is no abdominal tenderness.  Musculoskeletal:     Right lower leg: No edema.     Left lower leg: No edema.  Skin:    General: Skin is warm and dry.     Capillary Refill: Capillary refill takes less than 2 seconds.     Findings: No lesion.  Neurological:     General: No focal deficit present.     Mental Status: She is alert and oriented to person, place, and time. Mental status is at baseline.     ED Results / Procedures / Treatments   Labs (all labs ordered are listed, but only abnormal results are displayed) Labs Reviewed  CBC - Abnormal; Notable for the following components:      Result Value   RBC 5.27 (*)    MCV 79.7 (*)    MCH 25.0 (*)    All other components within normal limits  RESP PANEL BY  RT-PCR (RSV, FLU A&B, COVID)  RVPGX2  COMPREHENSIVE METABOLIC PANEL  HCG, QUANTITATIVE, PREGNANCY  TROPONIN I (HIGH SENSITIVITY)    EKG None  Radiology CT Angio Chest PE W and/or Wo Contrast Result Date: 12/05/2023 CLINICAL DATA:  27 year old female, shortness of breath with concern for pulmonary embolism. EXAM: CT ANGIOGRAPHY CHEST WITH CONTRAST TECHNIQUE: Multidetector CT imaging of the chest was performed using the standard protocol during bolus administration of intravenous contrast. Multiplanar CT image reconstructions and MIPs were obtained to evaluate the vascular anatomy. RADIATION DOSE REDUCTION: This exam was performed according to the departmental dose-optimization program which includes automated exposure control, adjustment of the  mA and/or kV according to patient size and/or use of iterative reconstruction technique. CONTRAST:  Seventy-five mL Omnipaque 350, intravenous COMPARISON:  None Available. FINDINGS: Cardiovascular: Satisfactory opacification of the pulmonary arteries to the segmental level. No evidence of pulmonary embolism. Normal heart size. No pericardial effusion. Mediastinum/Nodes: No enlarged mediastinal, hilar, or axillary lymph nodes. Thyroid gland, trachea, and esophagus demonstrate no significant findings. Lungs/Pleura: Scattered subpleural ground-glass opacities, most prominent at the left lower lobe in addition to the peribronchovascular and anterior subpleural regions of the right lower lobe. No pleural effusion or pneumothorax. Upper Abdomen: The visualized upper abdomen is within normal limits. Musculoskeletal: No chest wall abnormality. No acute or significant osseous findings. Review of the MIP images confirms the above findings. IMPRESSION: Vascular: No evidence of pulmonary embolism. Non-Vascular: Multifocal ground-glass opacities, most prominent the left lower lobe suggestive of multifocal pneumonia. Marliss Coots, MD [facility] Radiology Electronically Signed   By: Marliss Coots M.D.   On: 12/05/2023 13:13   DG Chest Port 1 View Result Date: 12/05/2023 CLINICAL DATA:  Cough and shortness of breath. Current everyday cigar smoker. EXAM: PORTABLE CHEST 1 VIEW COMPARISON:  03/30/2022 FINDINGS: Heart size and mediastinal contours appear normal. No pleural fluid or interstitial edema. Bilateral bronchial wall thickening identified. No airspace consolidation. Visualized osseous structures appear normal. IMPRESSION: Bilateral bronchial wall thickening compatible with bronchitis. Electronically Signed   By: Signa Kell M.D.   On: 12/05/2023 08:47    Procedures Procedures    Medications Ordered in ED Medications - No data to display  ED Course/ Medical  Decision Making/ A&P Clinical Course as of 12/05/23 1056  Mon Dec 05, 2023  1610 Reports improvement in symptoms, still feels like she is wheezing slightly.  [JB]    Clinical Course User Index [JB] Zaine Elsass, Horald Chestnut, PA-C                                 Medical Decision Making Amount and/or Complexity of Data Reviewed Labs: ordered. Radiology: ordered.  Risk Prescription drug management.   Sheniqua Kohnen 27 y.o. presented today for shortness of breath.  Working DDx that I considered at this time includes, but not limited to, asthma/COPD exacerbation, URI, viral illness, anemia, ACS, PE, pneumonia, pleural effusion, lung mass.  R/o DDx: These are considered less likely due to history of present illness, physical exam, labs/imaging findings  Pmhx: Denies   Review of prior external notes: None   Unique Tests and My Interpretation:  Respiratory panel negative, CBC without leukocytosis, no anemia.  CMP with no electrolyte abnormality, no AKI.  hCG negative.  Troponin is pending.  EKG with some inferior abnormality.  Obtaining troponin and CTA to rule out pulmonary embolism.  Chest x-ray with findings of acute bronchitis.  CTA  without pulmonary embolism, does show left lower lobe pneumonia.  Consult: none  Problem List / ED Course / Critical interventions / Medication management  Patient reporting to emergency room shortness of breath.  Patient is in no acute distress but has diffuse wheezing on exam.  Ordered steroids and albuterol for shortness of breath.  Any basic labs and chest x-ray to rule out pneumonia.  Given that patient has productive cough and shortness of breath I feel she would benefit from a course of antibiotics and I will send home with albuterol inhaler, and closely reassess.  Patient shortness of breath and improved.  Patient has been hemodynamically stable throughout stay, not hypoxic.  Will send patient home with outpatient antibiotics, and close follow-up for  pneumonia.  Given return precautions. I ordered medication including albuterol, prednisone  Reevaluation of the patient after these medicines showed that the patient improved Patients vitals assessed. Upon arrival patient is hemodynamically stable.  I have reviewed the patients home medicines and have made adjustments as needed   Plan:  F/u w/ PCP in 2-3d to ensure resolution of sx.  Patient was given return precautions. Patient stable for discharge at this time.  Patient educated on sx/dx and verbalized understanding of plan. Will return to ER for new or worsening sx.          Final Clinical Impression(s) / ED Diagnoses Final diagnoses:  Community acquired pneumonia of left lower lobe of lung    Rx / DC Orders ED Discharge Orders     None         Smitty Knudsen, PA-C 12/05/23 1418    Gloris Manchester, MD 12/05/23 1621

## 2023-12-05 NOTE — ED Notes (Signed)
Patient transported to CT 

## 2023-12-05 NOTE — ED Triage Notes (Signed)
Pt reports SHOB and sore throat that started on Friday. Denies coughing or fevers.

## 2023-12-05 NOTE — Discharge Instructions (Addendum)
You are seen in emergency room today for shortness of breath.  Your CT scan is consistent with pneumonia.  I have sent you home with an albuterol inhaler to take as needed for shortness of breath and wheezing.  I have also sent an antibiotic please take as prescribed.  Please take antibiotic with food to avoid upset stomach when taking it.  If you have any new or worsening symptoms please return to emergency room.  If your symptoms do not start to improve in 2 to 3 days please return to emergency room.  Please establish with primary care doctor.   Providers Accepting New Patients in [place], [place]    [facility] [address]  [place], [place] [address] [phone] Accepts most insurances  [facility] [address] [place], [place] [address] [phone] Accepts most insurances  [facility] [address] [place], [place] [address]  [phone] Must meet requirements  [facility] [address] [place], [place] [address] [phone] Accepts most insurances  [facility] [address]  [place], [place] [address] [phone] Accepts most insurances  [facility] [address]   [place], [place]   [phone] Accepts most insurances  [facility])  [phone] [address]  [place], [place] [address] [phone] Accepts most insurances     [facility] [address]  [place], [place] [address] [phone] Accepts most insurances  [facility] [address] [place], [place] [address] [phone] option 1 Accepts Medicaid and [facility] [address]  [place], [place] [address] [phone] Accepts most insurances  [place]on Gully, MD [address] [place], [place] [address] [phone] Accepts most insurances  [facility] at [facility] [address]  [place], [place] [address] [phone] Accepts most insurances  [facility] [phone] [address] [place], [place] [address] [phone] Accepts most insurances  [place], [place] [address] [place], [place] [address] [phone]  Accepts most insurances

## 2024-02-12 IMAGING — DX DG CHEST 2V
2 series · 2 of 2 positions shown · non-contrast
Comparison: None.

CLINICAL DATA: Cough, shortness of breath and wheezing.

EXAM:
CHEST - 2 VIEW

[chest pa]
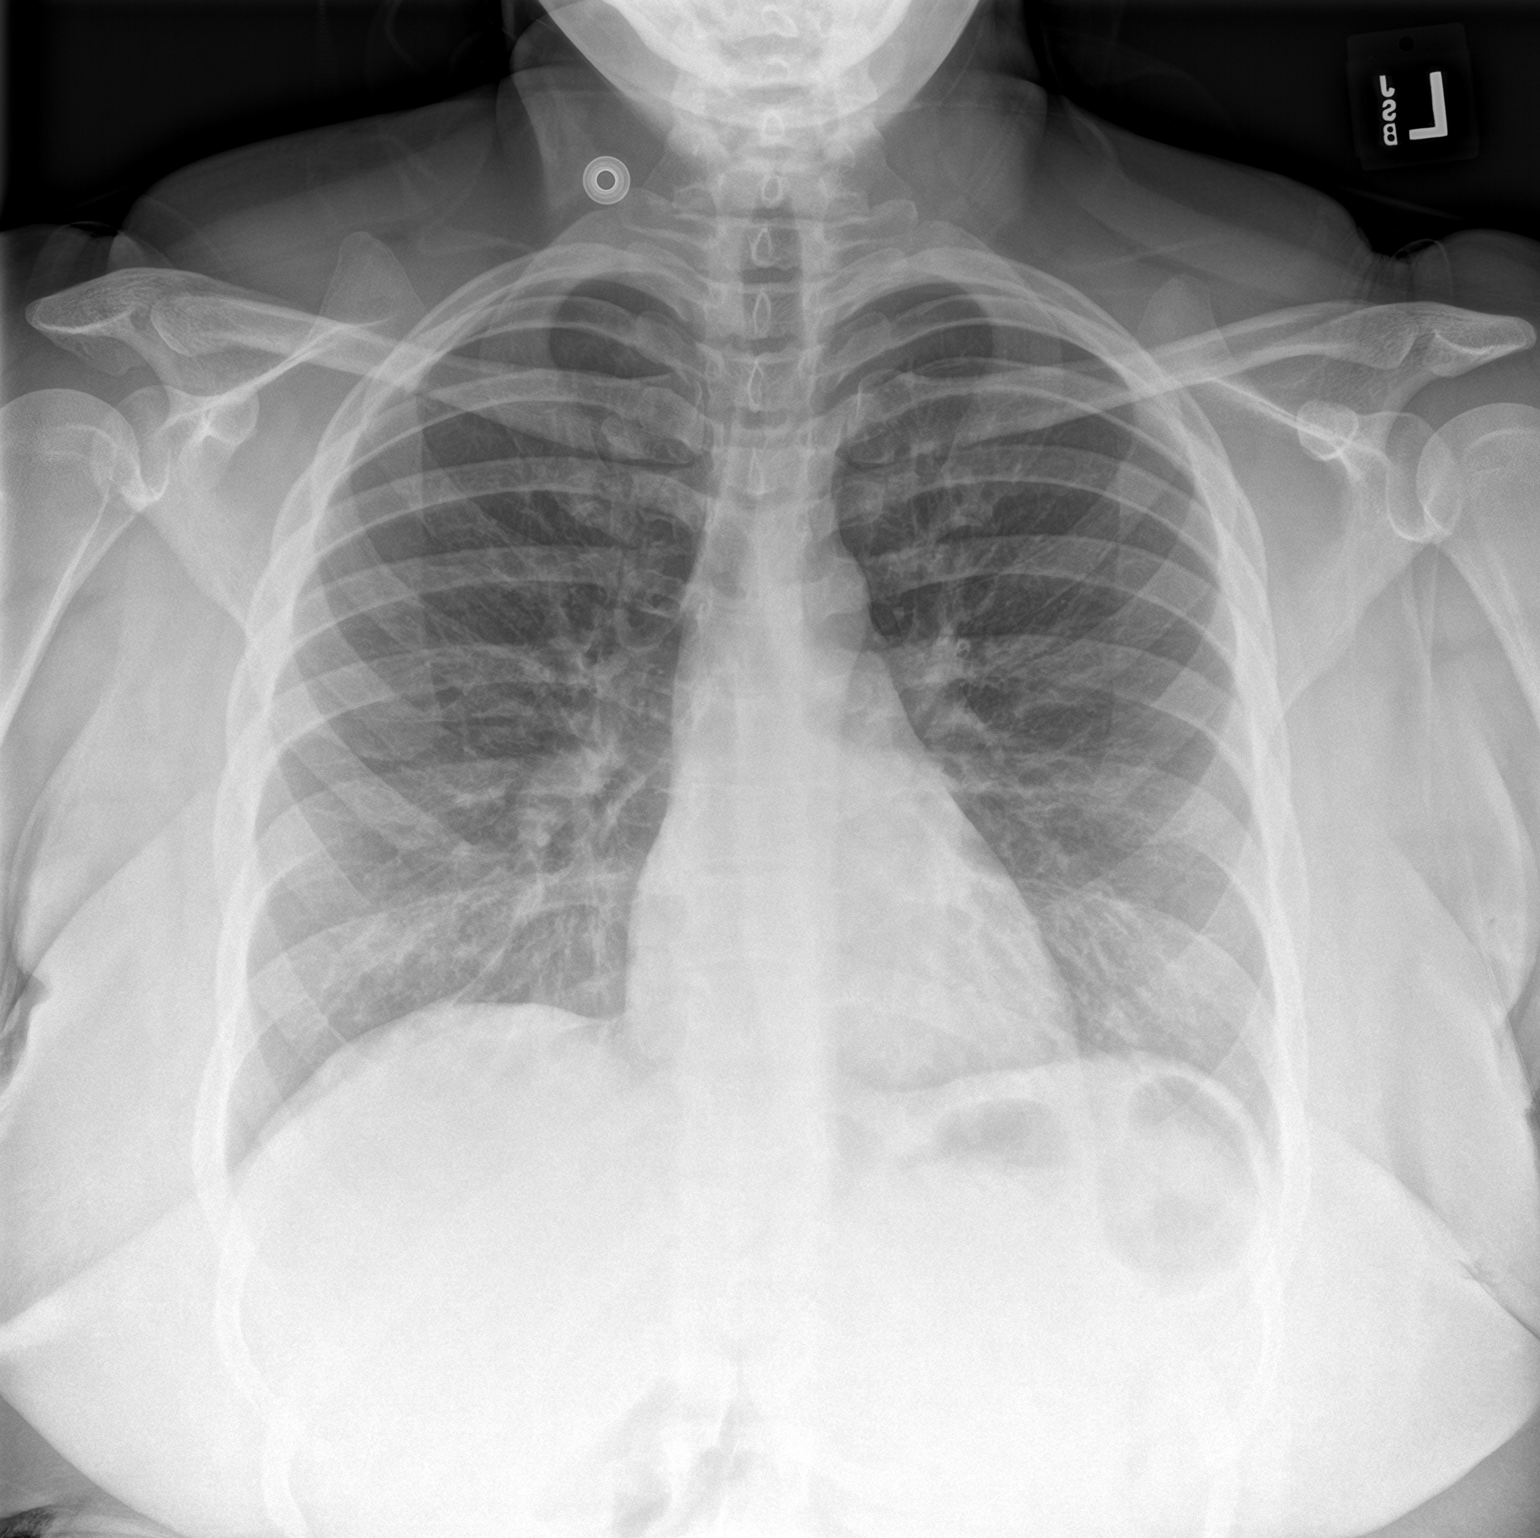

[chest lat]
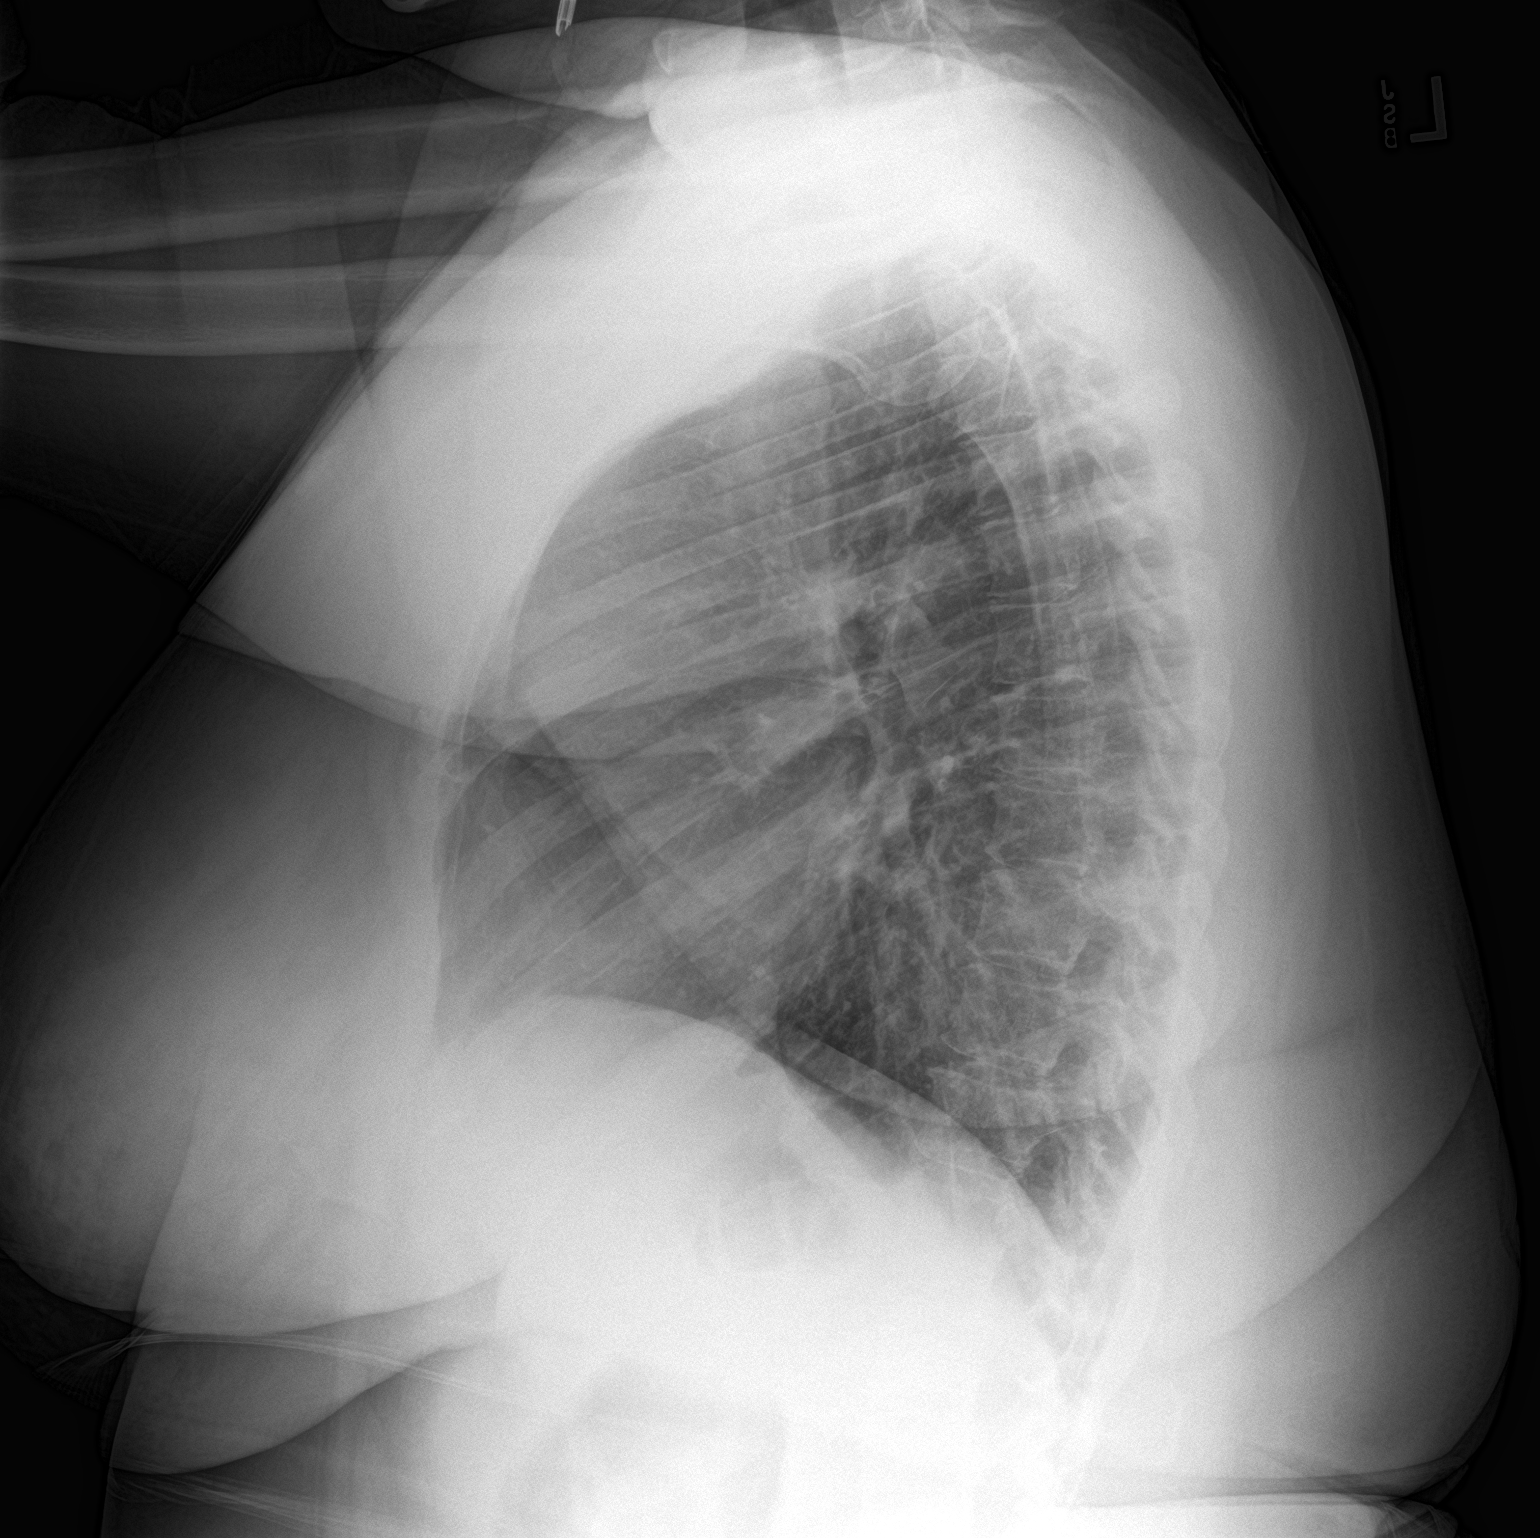

[2 of 2 positions shown; findings below may reference images not displayed]

FINDINGS: The cardiomediastinal contours are normal. Mild bronchial
thickening. Subsegmental bibasilar atelectasis. Pulmonary
vasculature is normal. No confluent consolidation, pleural effusion,
or pneumothorax. No acute osseous abnormalities are seen.
IMPRESSION: Mild bronchial thickening suggesting asthma or bronchitis.
Subsegmental bibasilar atelectasis.
